# Patient Record
Sex: Male | Born: 1995 | Race: Black or African American | Hispanic: No | Marital: Single | Smoking: Current every day smoker
Health system: Southern US, Community
[De-identification: ages and names within clinical notes are randomized; demographics above are authoritative.]

## PROBLEM LIST (undated history)

## (undated) HISTORY — PX: WISDOM TOOTH EXTRACTION: SHX21

---
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
## 2014-05-25 ENCOUNTER — Ambulatory Visit (INDEPENDENT_AMBULATORY_CARE_PROVIDER_SITE_OTHER): Payer: BC Managed Care – PPO | Admitting: *Deleted

## 2014-05-25 ENCOUNTER — Ambulatory Visit: Payer: BC Managed Care – PPO | Admitting: *Deleted

## 2014-05-25 DIAGNOSIS — Z23 Encounter for immunization: Secondary | ICD-10-CM | POA: Diagnosis not present

## 2014-05-30 ENCOUNTER — Ambulatory Visit (INDEPENDENT_AMBULATORY_CARE_PROVIDER_SITE_OTHER): Payer: BC Managed Care – PPO | Admitting: Family Medicine

## 2014-05-30 ENCOUNTER — Encounter: Payer: Self-pay | Admitting: Family Medicine

## 2014-05-30 VITALS — BP 134/89 | HR 102 | Temp 98.4°F | Ht 64.0 in | Wt 223.2 lb

## 2014-05-30 DIAGNOSIS — S8982XA Other specified injuries of left lower leg, initial encounter: Secondary | ICD-10-CM

## 2014-05-30 DIAGNOSIS — S838X2A Sprain of other specified parts of left knee, initial encounter: Secondary | ICD-10-CM | POA: Insufficient documentation

## 2014-05-30 NOTE — Patient Instructions (Signed)
Rest, ice and elevate your leg Try to have light duty Work with physical therapy You may need an MRI in the future if this doesn't improve.

## 2014-05-30 NOTE — Assessment & Plan Note (Addendum)
History and exam concerning but not typical for meniscal injury. No ligamentous laxity to indicate MRI at this point. Will refer for PT for further evaluation and management. If this could potentially be improved with surgery I would pull the trigger on the MRI.

## 2014-05-30 NOTE — Progress Notes (Signed)
Patient ID: Maurice Rubio, male   DOB: Nov 16, 1995, [age] y.o.   MRN: [id number]   Subjective:  Maurice CambridgeDashon Gallop is a 18 y.o. male here for recurrent left knee pain.  Date of injury: 05/27/2014 While running left knee suddenly felt painful on the medial aspect without popping or other sound. No instability/locking/giving way. Effusion developed over next few hours and has gradually gone down. Reports prior non-contact injury while playing basketball in March. X-rays from that encounter at urgent care showed nothing significant. He received no therapy and no therapy was recommended.  No fever, other arthralgias, myalgias, rash.   All other pertinent systems reviewed and are negative. Objective:  BP 134/89 mmHg  Pulse 102  Temp(Src) 98.4 F (36.9 C) (Oral)  Ht 5\' 4"  (1.626 m)  Wt 223 lb 3.2 oz (101.243 kg)  BMI 38.29 kg/m2  Gen: Athletic 18 y.o. male in no distress Left knee: Trace effusion without erythema. No pain on patellar palpation. +crepitus. Medial joint line tender. Lateral joint line nontender. Pain but no laxity with varus and valgus stress. Pain without laxity with anterior drawer or posterior drawer.  McMurray's positive. Full AROM. Gait normal.  Assessment & Plan:  Maurice CambridgeDashon Rigel is a 18 y.o. male with left knee re-injury concerning for meniscal pathology.

## 2014-06-14 ENCOUNTER — Ambulatory Visit: Payer: BC Managed Care – PPO | Admitting: Physical Therapy

## 2014-06-21 ENCOUNTER — Telehealth: Payer: Self-pay | Admitting: *Deleted

## 2014-06-21 ENCOUNTER — Ambulatory Visit: Payer: BC Managed Care – PPO | Attending: Family Medicine | Admitting: Physical Therapy

## 2014-06-21 DIAGNOSIS — Z5189 Encounter for other specified aftercare: Secondary | ICD-10-CM | POA: Diagnosis not present

## 2014-06-21 DIAGNOSIS — S838X2A Sprain of other specified parts of left knee, initial encounter: Secondary | ICD-10-CM

## 2014-06-21 DIAGNOSIS — S8992XD Unspecified injury of left lower leg, subsequent encounter: Secondary | ICD-10-CM | POA: Diagnosis not present

## 2014-06-21 DIAGNOSIS — X58XXXD Exposure to other specified factors, subsequent encounter: Secondary | ICD-10-CM | POA: Diagnosis not present

## 2014-06-21 DIAGNOSIS — M25562 Pain in left knee: Secondary | ICD-10-CM | POA: Diagnosis not present

## 2014-06-21 NOTE — Patient Instructions (Signed)
ADDUCTION: Side-Lying (Active)   Lie on left side, with top leg bent and in front of other leg. Lift straight leg up as high as possible.  Complete _1__ sets of _10__ repetitions. Perform _2-3__ sessions per day.  http://gtsc.exer.us/128   Copyright  VHI. All rights reserved.   Straight Leg Raise: With External Leg Rotation   Lie on back with right leg straight, opposite leg bent. Rotate straight leg out and lift _6-8___ inches. Hold 3 seconds. Repeat __10__ times per set. Do __1__ sets per session. Do _2-3___ sessions per day.  http://orth.exer.us/728   Copyright  VHI. All rights reserved.   Clarita CraneStephanie F Dynisha Due, PT, DPT 06/21/2014 12:19 PM  [facility] [address] [place], [place] [address]  [phone] (office) [phone] (fax)

## 2014-06-21 NOTE — Therapy (Signed)
[facility] [address] [place], [place], [address] Phone: [phone]   Fax:  [phone]  Physical Therapy Evaluation  Patient Details  Name: Maurice Rubio MRN: [id number] Date of Birth: 09-21-1995  Encounter Date: 06/21/2014      PT End of Session - 06/21/14 1241    Visit Number 1   Number of Visits 8   Date for PT Re-Evaluation 08/20/14   PT Start Time 1158  pt arrived 10 min late for appt   PT Stop Time 1225   PT Time Calculation (min) 27 min   Activity Tolerance Patient tolerated treatment well   Behavior During Therapy [facility] for tasks assessed/performed      No past medical history on file.  No past surgical history on file.  There were no vitals taken for this visit.  Visit Diagnosis:  Acute meniscal injury of left knee, initial encounter - Plan: PT plan of care cert/re-cert  Knee pain, acute, left - Plan: PT plan of care cert/re-cert      Subjective Assessment - 06/21/14 1159    Symptoms Pt is a 18 y/o male who presents to OPPT for L knee injury.  Pt reports he "tore 2 ligaments" in L knee about 6 months ago.  Pt reports he was playing basketball when injury occurred and continues to report  instability and subluxation.   Limitations Walking   How long can you walk comfortably? 45 min-1 hour   Patient Stated Goals improve strength and stability of knee, decrease pain   Currently in Pain? Yes   Pain Score 5    Pain Location Knee   Pain Orientation Left   Pain Descriptors / Indicators Aching   Pain Type Chronic pain   Pain Onset 1 to 4 weeks ago   Pain Frequency Intermittent   Aggravating Factors  activity   Pain Relieving Factors medication          [facility] PT Assessment - 06/21/14 1202    Assessment   Medical Diagnosis L knee injury   Onset Date 12/20/13  approximate   Next MD Visit PRN   Prior Therapy none for knee   Precautions   Precautions None   Restrictions   Weight Bearing Restrictions No    Balance Screen   Has the patient fallen in the past 6 months No   Has the patient had a decrease in activity level because of a fear of falling?  No   Is the patient reluctant to leave their home because of a fear of falling?  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[employer]; Midwifestudying fire protection   Leisure basketball   Cognition   Overall Cognitive Status Within Functional Limits for tasks assessed   Observation/Other Assessments   Observations point tenderness at medial tibial plateau L knee   Focus on Therapeutic Outcomes (FOTO)  FOTO 40 (60% limited; predicted 34% limited)   AROM   Left Knee Extension 0   Left Knee Flexion 120  ERP; equal to L knee   Strength   Right Hip Flexion 5/5   Right Hip Extension 5/5   Right Hip ABduction 4/5   Left Hip Flexion 4/5   Left Hip Extension 3+/5  Left Hip ABduction 4/5   Left Hip ADduction 3+/5   Right Knee Flexion 5/5   Right Knee Extension 5/5   Left Knee Flexion 4/5   Left Knee Extension 3+/5  with pain   Special Tests    Special Tests Knee Special Tests;Plica Tests;Meniscus Tests   Knee Special tests  other;other2   Meniscus Tests McMurray Test   other    Side  Left   Comments valgus stress test with 20 degrees flexion revealed pain and mild instability   other   findings Negative   Side Left   Comments Anterior Drawer   McMurray Test   Findings Positive   Side Left   Comments pain with both internal and external rotation            PT Education - 06/21/14 1241    Education provided Yes   Education Details HEP for VMO and hip addct strengthening   Person(s) Educated Patient   Methods Demonstration;Handout;Explanation    Comprehension Verbalized understanding;Returned demonstration;Need further instruction;Verbal cues required            PT Long Term Goals - 06/21/14 1244    PT LONG TERM GOAL #1   Title independent with HEP (07/19/14)   Baseline no HEP   Time 4   Period Weeks   Status New   PT LONG TERM GOAL #2   Title report pain < 3/10 (07/19/14)   Baseline pain 6/10 with movement   Time 4   Period Weeks   Status New   PT LONG TERM GOAL #3   Title report no episodes of knee instability or subluxation to demonstrate improved strength (07/19/14)   Baseline episodes of L knee instability and pain; L knee/hip weakness   Time 4   Period Weeks   Status New          Plan - 06/21/14 1241    Clinical Impression Statement Pt presents to OPPT with L knee instability and pain.  Pt with VMO weakness and will benefit from PT to decrease pain and maximize function.   Pt will benefit from skilled therapeutic intervention in order to improve on the following deficits Pain;Decreased strength;Impaired perceived functional ability;Decreased activity tolerance;Decreased range of motion   Rehab Potential Good   PT Frequency 2x / week   PT Duration 4 weeks   PT Treatment/Interventions ADLs/Self Care Home Management;Moist Heat;Therapeutic activities;Patient/family education;Passive range of motion;Therapeutic exercise;Ultrasound;Balance training;Manual techniques;Neuromuscular re-education;Cryotherapy;Electrical Stimulation;Functional mobility training   PT Next Visit Plan review HEP and progress VMO/hip/knee strengthening   Consulted and Agree with Plan of Care Patient                              Problem List Patient Active Problem List   Diagnosis Date Noted  . Acute medial meniscal injury of left knee 05/30/2014   Clarita CraneStephanie F Kache Mcclurg, PT, DPT 06/21/2014 12:48 PM  [facility] [address] [place], [place] [address]  [phone] (office) [phone]  (fax)

## 2014-06-21 NOTE — Telephone Encounter (Signed)
appts made and printed...td 

## 2014-06-27 ENCOUNTER — Ambulatory Visit: Payer: BC Managed Care – PPO | Admitting: Physical Therapy

## 2014-06-27 DIAGNOSIS — Z5189 Encounter for other specified aftercare: Secondary | ICD-10-CM | POA: Diagnosis not present

## 2014-06-27 DIAGNOSIS — S838X2A Sprain of other specified parts of left knee, initial encounter: Secondary | ICD-10-CM

## 2014-06-27 DIAGNOSIS — M25562 Pain in left knee: Secondary | ICD-10-CM

## 2014-06-27 NOTE — Therapy (Signed)
[facility] [address] [place], [place], [address] Phone: [phone]   Fax:  [phone]  Physical Therapy Treatment  Patient Details  Name: Maurice CambridgeDashon Pettaway MRN: [id number] Date of Birth: Oct 29, 1995  Encounter Date: 06/27/2014      PT End of Session - 06/27/14 1341    PT Start Time 1105   PT Stop Time 1200   PT Time Calculation (min) 55 min   Activity Tolerance Patient tolerated treatment well      No past medical history on file.  No past surgical history on file.  There were no vitals taken for this visit.  Visit Diagnosis:  Acute meniscal injury of left knee, initial encounter  Knee pain, acute, left      Subjective Assessment - 06/27/14 1139    Currently in Pain? Yes   Pain Score 5    Pain Relieving Factors rest   Effect of Pain on Daily Activities increase with activities.            [facility] Adult PT Treatment/Exercise - 06/27/14 1115    Knee/Hip Exercises: Stretches   Active Hamstring Stretch 3 reps;30 seconds   Gastroc Stretch 3 reps;30 seconds   Knee/Hip Exercises: Supine   Quad Sets 2 sets  5 seconds   Short Arc Quad Sets Limitations --  0 Lbs. cued to keep knee neutral to decrease pain, 3 second    Straight Leg Raises 2 sets  6 inches, 5 second holds.   Straight Leg Raises Limitations --  shakey   Knee/Hip Exercises: Sidelying   Hip ABduction 2 sets;Left   Hip ADduction 2 sets  doing for home exercise   Knee/Hip Exercises: Prone   Hip Extension 2 sets;Left   Cryotherapy   Number Minutes Cryotherapy --  10   Cryotherapy Location Knee  Left              PT Long Term Goals - 06/27/14 1133    PT LONG TERM GOAL #1   Title independent with HEP (07/19/14)   Time 4   Period Weeks   Status On-going   PT LONG TERM GOAL #2   Title report pain < 3/10 (07/19/14)   Time 4   Period Weeks   Status On-going   PT LONG TERM GOAL #3   Title report no episodes of knee instability or subluxation to  demonstrate improved strength (07/19/14)   Time 4   Period Weeks   Status On-going          Plan - 06/27/14 1342    Clinical Impression Statement soreness with                                Problem List Patient Active Problem List   Diagnosis Date Noted  . Acute medial meniscal injury of left knee 05/30/2014  Liz BeachKaren Harris, PTA 06/27/2014 1:48 PM Phone: [phone] Fax: [phone]   HARRIS,KAREN 06/27/2014, 1:48 PM

## 2014-06-29 ENCOUNTER — Ambulatory Visit: Payer: BC Managed Care – PPO | Admitting: Physical Therapy

## 2014-07-04 ENCOUNTER — Ambulatory Visit: Payer: BC Managed Care – PPO | Admitting: Physical Therapy

## 2014-07-04 DIAGNOSIS — Z5189 Encounter for other specified aftercare: Secondary | ICD-10-CM | POA: Diagnosis not present

## 2014-07-04 DIAGNOSIS — S838X2A Sprain of other specified parts of left knee, initial encounter: Secondary | ICD-10-CM

## 2014-07-04 NOTE — Therapy (Signed)
[facility] [address] [place], [place], [address] Phone: [phone]   Fax:  [phone]  Physical Therapy Treatment  Patient Details  Name: Maurice Rubio MRN: [id number] Date of Birth: 01-14-1996  Encounter Date: 07/04/2014      PT End of Session - 07/04/14 1142    Visit Number 3   Number of Visits 8   Date for PT Re-Evaluation 07/20/14   PT Start Time 1105   PT Stop Time 1145   PT Time Calculation (min) 40 min   Activity Tolerance Patient tolerated treatment well      No past medical history on file.  No past surgical history on file.  There were no vitals taken for this visit.  Visit Diagnosis:  Acute meniscal injury of left knee, initial encounter      Subjective Assessment - 07/04/14 1107    Symptoms No pain now, last pain at the gym with squats with pops.                      [facility] Adult PT Treatment/Exercise - 07/04/14 1105    Knee/Hip Exercises: Aerobic   Stationary Bike 5 minutes   Knee/Hip Exercises: Machines for Strengthening   Cybex Knee Extension --  2 plates extend, too heavy so 1  Left knee hold and release.   Cybex Knee Flexion --  2 plates bend Lt  leg eccentric 15 reps.   Total Gym Leg Press 20 reps, 2 legs   Knee/Hip Exercises: Standing   Forward Step Up Left;2 sets   Wall Squat 10 reps;3 seconds  with ball   Knee/Hip Exercises: Supine   Bridges 2 sets  with ball squeeze   Knee/Hip Exercises: Sidelying   Hip ABduction 2 sets;Left   Hip ADduction Limitations 3+/5   Knee/Hip Exercises: Prone   Hip Extension --  quadriped bent knee lifts, difficult   Hip Extension Limitations 3 +/5   Straight Leg Raises Limitations --  15 reps 2 sets   Other Prone Exercises --  Adduction 3+/5                     PT Long Term Goals - 07/04/14 1108    PT LONG TERM GOAL #2   Title report pain < 3/10 (07/19/14)   Time 4   Status On-going   PT LONG TERM GOAL #3   Title  report no episodes of knee instability or subluxation to demonstrate improved strength (07/19/14)   Time 4   Period Weeks   Status Achieved               Plan - 07/04/14 1143    Clinical Impression Statement strength improving.  Needed cues for squat technique which was painful in the gym,  able to do no pain here.   PT Next Visit Plan add hip strengthening program to home.   Consulted and Agree with Plan of Care Patient        Problem List Patient Active Problem List   Diagnosis Date Noted  . Acute medial meniscal injury of left knee 05/30/2014  Liz BeachKaren Kamela Blansett, PTA 07/04/2014 11:45 AM Phone: [phone] Fax: [phone]   [facility],Kaylon Hitz 07/04/2014, 11:45 AM  [facility] [address] [place], [place], [address] Phone: [phone]   Fax:  [phone]

## 2014-07-07 ENCOUNTER — Ambulatory Visit: Payer: BC Managed Care – PPO | Admitting: Physical Therapy

## 2014-07-07 DIAGNOSIS — Z5189 Encounter for other specified aftercare: Secondary | ICD-10-CM | POA: Diagnosis not present

## 2014-07-07 DIAGNOSIS — M25562 Pain in left knee: Secondary | ICD-10-CM

## 2014-07-07 DIAGNOSIS — S838X2A Sprain of other specified parts of left knee, initial encounter: Secondary | ICD-10-CM

## 2014-07-07 NOTE — Therapy (Addendum)
[place], [place], [address] Phone: [phone]   Fax:  [phone]  Physical Therapy Treatment  Patient Details  Name: Lennie Vasco MRN: [id number] Date of Birth: 1996/04/01  Encounter Date: 07/07/2014      PT End of Session - 07/07/14 1150    Visit Number 4   Number of Visits 8   Date for PT Re-Evaluation 07/20/14   PT Start Time 1118   PT Stop Time 1149   PT Time Calculation (min) 31 min   Activity Tolerance Patient tolerated treatment well;Patient limited by fatigue   Behavior During Therapy [facility] for tasks assessed/performed      No past medical history on file.  No past surgical history on file.  There were no vitals taken for this visit.  Visit Diagnosis:  Acute meniscal injury of left knee, initial encounter  Knee pain, acute, left      Subjective Assessment - 07/07/14 1120    Symptoms Knee is "ok."  Still concerned about knee buckling but no occurrences yet.  Feels knee is getting stronger. (Pt arrived late to PT session)   How long can you walk comfortably? no difficulty recently   Patient Stated Goals improve strength and stability of knee, decrease pain   Currently in Pain? No/denies  pain yesterday, 4/10 L knee playing basketball                    [facility] Adult PT Treatment/Exercise - 07/07/14 1122    Knee/Hip Exercises: Aerobic   Elliptical Incline 5, Resistance 4 x 8 min   Knee/Hip Exercises: Machines for Strengthening   Cybex Leg Press 120# with ball squeeze 2x15; LLE only 60# 2x 15 reps   Knee/Hip Exercises: Supine   Short Arc Quad Sets Strengthening;Left;15 reps;1 Tax inspector Limitations 3# with initially a little pain; decreased with repetition   Bridges 15 reps;Both  with ball squeeze   Straight Leg Raise with External Rotation Strengthening;Left;15 reps   Straight Leg Raise with External Rotation Limitations 3#   Knee/Hip Exercises:  Sidelying   Hip ABduction Left;15 reps;1 set;Strengthening   Hip ABduction Limitations 3#   Knee/Hip Exercises: Prone   Hamstring Curl 1 set;15 reps   Hamstring Curl Limitations 3#   Hip Extension Strengthening;Left;15 reps  with bent knee   Hip Extension Limitations 3#   Straight Leg Raises Left;Strengthening;1 set;15 reps   Straight Leg Raises Limitations 3#   Other Prone Exercises quadruped bent knee hip ext x 10 bil; quadruped hip abdct x 10 bil                PT Education - 07/07/14 1150    Education provided No             PT Long Term Goals - 07/07/14 1152    PT LONG TERM GOAL #1   Title independent with HEP (07/19/14)   Baseline no HEP   Time 4   Period Weeks   Status On-going   PT LONG TERM GOAL #2   Title report pain < 3/10 (07/19/14)   Baseline pain 6/10 with movement   Time 4   Period Weeks   Status On-going   PT LONG TERM GOAL #3   Title report no episodes of knee instability or subluxation to demonstrate improved strength (07/19/14)   Baseline episodes of L knee instability and pain; L knee/hip weakness   Period Weeks   Status Achieved  Plan - 07/07/14 1151    Clinical Impression Statement Pt noticably fatigued with hip and knee strengthening exercises.  Progressing well towards goals without any episodes of L knee buckling.   PT Next Visit Plan add hip strengthening program to home (quadruped)   Consulted and Agree with Plan of Care Patient        Problem List Patient Active Problem List   Diagnosis Date Noted  . Acute medial meniscal injury of left knee 05/30/2014    Laureen Abrahams, PT, DPT 07/07/2014 11:53 AM  [facility] [address] [place], [place], [address] Phone: [phone]   Fax:  [phone]     PHYSICAL THERAPY DISCHARGE SUMMARY  Visits from Start of Care: 4  Current functional level related to goals / functional outcomes: See above;  pt met 1 LTG and did not show for remaining scheduled appointments.   Remaining deficits: Unknown    Education / Equipment: HEP  Plan: Patient agrees to discharge.  Patient goals were partially met. Patient is being discharged due to not returning since the last visit.  ?????    Laureen Abrahams, PT, DPT 09/16/2014 8:54 AM  [facility] [address],  [address]  [phone] (office) [phone] (fax)

## 2014-07-19 ENCOUNTER — Ambulatory Visit: Payer: BC Managed Care – PPO | Attending: Family Medicine | Admitting: Physical Therapy

## 2014-07-19 DIAGNOSIS — S8992XD Unspecified injury of left lower leg, subsequent encounter: Secondary | ICD-10-CM | POA: Insufficient documentation

## 2014-07-19 DIAGNOSIS — M25562 Pain in left knee: Secondary | ICD-10-CM | POA: Insufficient documentation

## 2014-07-19 DIAGNOSIS — Z5189 Encounter for other specified aftercare: Secondary | ICD-10-CM | POA: Insufficient documentation

## 2014-07-21 ENCOUNTER — Ambulatory Visit: Payer: BC Managed Care – PPO | Admitting: Physical Therapy

## 2014-11-10 ENCOUNTER — Encounter (HOSPITAL_COMMUNITY): Payer: Self-pay | Admitting: *Deleted

## 2014-11-10 ENCOUNTER — Emergency Department (INDEPENDENT_AMBULATORY_CARE_PROVIDER_SITE_OTHER)
Admission: EM | Admit: 2014-11-10 | Discharge: 2014-11-10 | Disposition: A | Discharge status: Home / Self Care | Payer: BC Managed Care – PPO | Source: Home / Self Care | Attending: Emergency Medicine | Admitting: Emergency Medicine

## 2014-11-10 ENCOUNTER — Emergency Department (INDEPENDENT_AMBULATORY_CARE_PROVIDER_SITE_OTHER): Payer: BC Managed Care – PPO

## 2014-11-10 DIAGNOSIS — S62639A Displaced fracture of distal phalanx of unspecified finger, initial encounter for closed fracture: Secondary | ICD-10-CM

## 2014-11-10 MED ORDER — TRAMADOL HCL 50 MG PO TABS: 50.0000 mg | ORAL_TABLET | Freq: Four times a day (QID) | ORAL | Status: DC | PRN

## 2014-11-10 MED ORDER — IBUPROFEN 800 MG PO TABS: 800.0000 mg | ORAL_TABLET | Freq: Three times a day (TID) | ORAL | Status: DC

## 2014-11-10 NOTE — ED Notes (Signed)
Pt  Reports  He  smashed  His  r  Pointer  Finger  With a    Hammer    yest      Pt has   Pain    And  Swelling   To the  finger  With   Bleeding  Under  The  Nail

## 2014-11-10 NOTE — Discharge Instructions (Signed)
Finger Fracture °Fractures of fingers are breaks in the bones of the fingers. There are many types of fractures. There are different ways of treating these fractures. Your health care provider will discuss the best way to treat your fracture. °CAUSES °Traumatic injury is the main cause of broken fingers. These include: °· Injuries while playing sports. °· Workplace injuries. °· Falls. °RISK FACTORS °Activities that can increase your risk of finger fractures include: °· Sports. °· Workplace activities that involve machinery. °· A condition called osteoporosis, which can make your bones less dense and cause them to fracture more easily. °SIGNS AND SYMPTOMS °The main symptoms of a broken finger are pain and swelling within 15 minutes after the injury. Other symptoms include: °· Bruising of your finger. °· Stiffness of your finger. °· Numbness of your finger. °· Exposed bones (compound fracture) if the fracture is severe. °DIAGNOSIS  °The best way to diagnose a broken bone is with X-ray imaging. Additionally, your health care provider will use this X-ray image to evaluate the position of the broken finger bones.  °TREATMENT  °Finger fractures can be treated with:  °· Nonreduction--This means the bones are in place. The finger is splinted without changing the positions of the bone pieces. The splint is usually left on for about a week to 10 days. This will depend on your fracture and what your health care provider thinks. °· Closed reduction--The bones are put back into position without using surgery. The finger is then splinted. °· Open reduction and internal fixation--The fracture site is opened. Then the bone pieces are fixed into place with pins or some type of hardware. This is seldom required. It depends on the severity of the fracture. °HOME CARE INSTRUCTIONS  °· Follow your health care provider's instructions regarding activities, exercises, and physical therapy. °· Only take over-the-counter or prescription  medicines for pain, discomfort, or fever as directed by your health care provider. °SEEK MEDICAL CARE IF: °You have pain or swelling that limits the motion or use of your fingers. °SEEK IMMEDIATE MEDICAL CARE IF:  °Your finger becomes numb. °MAKE SURE YOU:  °· Understand these instructions. °· Will watch your condition. °· Will get help right away if you are not doing well or get worse. °Document Released: 10/13/2000 Document Revised: 04/21/2013 Document Reviewed: 02/10/2013 °ExitCare® Patient Information ©2015 [employer]. This information is not intended to replace advice given to you by your health care provider. Make sure you discuss any questions you have with your health care provider. ° °Cast or Splint Care °Casts and splints support injured limbs and keep bones from moving while they heal.  °HOME CARE °· Keep the cast or splint uncovered during the drying period. °¨ A plaster cast can take 24 to 48 hours to dry. °¨ A fiberglass cast will dry in less than 1 hour. °· Do not rest the cast on anything harder than a pillow for 24 hours. °· Do not put weight on your injured limb. Do not put pressure on the cast. Wait for your doctor's approval. °· Keep the cast or splint dry. °¨ Cover the cast or splint with a plastic bag during baths or wet weather. °¨ If you have a cast over your chest and belly (trunk), take sponge baths until the cast is taken off. °¨ If your cast gets wet, dry it with a towel or blow dryer. Use the cool setting on the blow dryer. °· Keep your cast or splint clean. Wash a dirty cast with a damp   cloth. °· Do not put any objects under your cast or splint. °· Do not scratch the skin under the cast with an object. If itching is a problem, use a blow dryer on a cool setting over the itchy area. °· Do not trim or cut your cast. °· Do not take out the padding from inside your cast. °· Exercise your joints near the cast as told by your doctor. °· Raise (elevate) your injured limb on 1 or 2 pillows  for the first 1 to 3 days. °GET HELP IF: °· Your cast or splint cracks. °· Your cast or splint is too tight or too loose. °· You itch badly under the cast. °· Your cast gets wet or has a soft spot. °· You have a bad smell coming from the cast. °· You get an object stuck under the cast. °· Your skin around the cast becomes red or sore. °· You have new or more pain after the cast is put on. °GET HELP RIGHT AWAY IF: °· You have fluid leaking through the cast. °· You cannot move your fingers or toes. °· Your fingers or toes turn blue or white or are cool, painful, or puffy (swollen). °· You have tingling or lose feeling (numbness) around the injured area. °· You have bad pain or pressure under the cast. °· You have trouble breathing or have shortness of breath. °· You have chest pain. °Document Released: 10/31/2010 Document Revised: 03/03/2013 Document Reviewed: 01/07/2013 °ExitCare® Patient Information ©2015 [employer]. This information is not intended to replace advice given to you by your health care provider. Make sure you discuss any questions you have with your health care provider. ° °

## 2014-11-10 NOTE — ED Provider Notes (Signed)
CSN: [id number]     Arrival date & time 11/10/14  1021 History   None    Chief Complaint  Patient presents with  . Finger Injury   (Consider location/radiation/quality/duration/timing/severity/associated sxs/prior Treatment) HPI       19 year old male presents for evaluation of right index finger pain. He smashed it with a hammer yesterday. Now there is some bleeding under the nail and some bruising in the finger. It is extremely painful and tender. No numbness. No other injury  History reviewed. No pertinent past medical history. History reviewed. No pertinent past surgical history. History reviewed. No pertinent family history. History  Substance Use Topics  . Smoking status: Never Smoker   . Smokeless tobacco: Not on file  . Alcohol Use: No    Review of Systems  Musculoskeletal:       Right index finger pain and swelling  All other systems reviewed and are negative.   Allergies  Review of patient's allergies indicates no known allergies.  Home Medications   Prior to Admission medications   Medication Sig Start Date End Date Taking? Authorizing Provider  ibuprofen (ADVIL,MOTRIN) 800 MG tablet Take 1 tablet (800 mg total) by mouth 3 (three) times daily. 11/10/14   Graylon Good, PA-C  traMADol (ULTRAM) 50 MG tablet Take 1 tablet (50 mg total) by mouth every 6 (six) hours as needed. 11/10/14   Adrian Blackwater Donterius Filley, PA-C   BP 142/87 mmHg  Pulse 71  Temp(Src) 98.1 F (36.7 C) (Oral)  Resp 16  SpO2 100% Physical Exam  Constitutional: He is oriented to person, place, and time. He appears well-developed and well-nourished. No distress.  HENT:  Head: Normocephalic.  Pulmonary/Chest: Effort normal. No respiratory distress.  Musculoskeletal:       Right hand: He exhibits tenderness and swelling.  The right index finger over the distal phalange is swollen, ecchymotic. There is no subungual hematoma. It is tender. No loss of sensation  Neurological: He is alert and oriented to  person, place, and time. Coordination normal.  Skin: Skin is warm and dry. No rash noted. He is not diaphoretic.  Psychiatric: He has a normal mood and affect. Judgment normal.  Nursing note and vitals reviewed.   ED Course  Procedures (including critical care time) Labs Review Labs Reviewed - No data to display  Imaging Review Dg Finger Index Right  11/10/2014   CLINICAL DATA:  Finger hit with sledgehammer  EXAM: RIGHT SECOND FINGER 2+V  COMPARISON:  None.  FINDINGS: Frontal, oblique, and lateral views obtained. There is a tiny calcification located just lateral to the distal aspect of the second distal phalanx, likely representing a tiny avulsion type injury. No other evidence suggesting fracture. No dislocation. Joint spaces appear intact.  IMPRESSION: Tiny avulsion arising from the lateral aspect of the distal aspect of the second distal phalanx. No other evidence fracture. No dislocation. No appreciable arthropathic change.   Electronically Signed   By: Bretta Bang III M.D.   On: 11/10/2014 12:11     MDM   1. Closed fracture of tuft of distal phalanx of finger, initial encounter    Very minor avulsion fracture. We'll put on a finger splint for protection and have him follow-up with orthopedics.  Meds ordered this encounter  Medications  . ibuprofen (ADVIL,MOTRIN) 800 MG tablet    Sig: Take 1 tablet (800 mg total) by mouth 3 (three) times daily.    Dispense:  60 tablet    Refill:  0  . traMADol (  ULTRAM) 50 MG tablet    Sig: Take 1 tablet (50 mg total) by mouth every 6 (six) hours as needed.    Dispense:  15 tablet    Refill:  0     Graylon GoodZachary H Ebb Carelock, PA-C 11/10/14 1227

## 2014-11-21 ENCOUNTER — Ambulatory Visit (INDEPENDENT_AMBULATORY_CARE_PROVIDER_SITE_OTHER): Payer: BC Managed Care – PPO | Admitting: Family Medicine

## 2014-11-21 ENCOUNTER — Encounter: Payer: Self-pay | Admitting: Family Medicine

## 2014-11-21 VITALS — BP 130/81 | HR 69 | Temp 98.8°F | Ht 64.0 in | Wt 234.0 lb

## 2014-11-21 DIAGNOSIS — S8982XA Other specified injuries of left lower leg, initial encounter: Secondary | ICD-10-CM | POA: Diagnosis not present

## 2014-11-21 DIAGNOSIS — S838X2A Sprain of other specified parts of left knee, initial encounter: Secondary | ICD-10-CM

## 2014-11-21 DIAGNOSIS — S62660G Nondisplaced fracture of distal phalanx of right index finger, subsequent encounter for fracture with delayed healing: Secondary | ICD-10-CM

## 2014-11-21 DIAGNOSIS — IMO0001 Reserved for inherently not codable concepts without codable children: Secondary | ICD-10-CM | POA: Insufficient documentation

## 2014-11-21 NOTE — Assessment & Plan Note (Signed)
Healing well.  Continue in dorsal splint for about 7-10 days.  Then use PRN for anything that could damage joint. F/U PRN

## 2014-11-21 NOTE — Assessment & Plan Note (Signed)
Concern about meniscal tear, recalictrant to PT.   - MRI of L knee w/o contrast.  - F/U in 2-3 weeks to discuss results and options.

## 2014-11-21 NOTE — Patient Instructions (Signed)
Please get the MRI of your L knee.  Thanks Dr. Paulina FusiHess

## 2014-11-21 NOTE — Progress Notes (Signed)
Maurice Rubio is a 19 y.o. male who presents today for R 2nd finger fx along with L knee pain.  L Knee Pain - ongoing issue for pt for several months now.  Seen in November 2015 for L knee pain after injury, concerning for meniscal injury at that time.   He went to PT for the last several months without improvement.  Does endorse locking at times but denies giving out.  Has tried some OTC medications as well without much relief.  2nd distal phalanx fx - Initially seen in urgent care several weeks ago, X-ray of 2nd digit distal phalanx of R hand showing small avulsion fx.  Tx with finger split at DIP joint, which has done well.  Compliant with the splint, healing well, denies repeat injury or swelling.  Still with some slight pain.   No past medical history on file.  History  Smoking status  . Never Smoker   Smokeless tobacco  . Not on file    No family history on file.  Current Outpatient Prescriptions on File Prior to Visit  Medication Sig Dispense Refill  . ibuprofen (ADVIL,MOTRIN) 800 MG tablet Take 1 tablet (800 mg total) by mouth 3 (three) times daily. 60 tablet 0  . traMADol (ULTRAM) 50 MG tablet Take 1 tablet (50 mg total) by mouth every 6 (six) hours as needed. 15 tablet 0   No current facility-administered medications on file prior to visit.    ROS: Per HPI.  All other systems reviewed and are negative.   Physical Exam Filed Vitals:   11/21/14 1456  BP: 130/81  Pulse: 69  Temp: 98.8 F (37.1 C)    Physical Examination: General appearance - alert, well appearing, and in no distress Musculoskeletal -  R hand - Normal inspection.  Slight TTP at radial aspect of distal phalanx, full ROM and MS. L knee - Normal inspection, TTP at medial joint line.  Knee: ROM normal in flexion and extension and lower leg rotation. Ligaments with solid consistent endpoints including ACL, PCL, LCL, MCL. Positive Mcmurray's and provocative meniscal tests. Non painful patellar  compression. Patellar and quadriceps tendons unremarkable. Hamstring and quadriceps strength is normal.

## 2014-11-23 ENCOUNTER — Telehealth: Payer: Self-pay | Admitting: Family Medicine

## 2014-11-23 NOTE — Progress Notes (Signed)
That should be fine.  Please have this printed for him and if he needs it signed, please page me to come do this.  Thanks Tesoro CorporationBryan R. Simya Tercero, DO [facility] 11/23/2014, 11:15 AM

## 2014-11-23 NOTE — Telephone Encounter (Signed)
Letter printed and left w/ CMA.   Twana FirstBryan R. Paulina FusiHess, DO of [facility] 11/23/2014, 1:44 PM

## 2014-11-23 NOTE — Telephone Encounter (Signed)
Patient was seen in office by Dr. Paulina FusiHess on Monday, Nov 21, 2014 because of his broke finger. He is returning to work next week and would like a Physicist, medicalletter for work, with Dr. Paulina FusiHess' authorization. Please call the patient if and when the letter is ready. If, for some reason, it is not authorized, please contact the patient anyway to let him know. Thank you, Dorothey BasemanSadie Reynolds, ASA

## 2014-11-23 NOTE — Telephone Encounter (Signed)
Pt informed, he will come to pick up now. Maurice Rubio, Maurice Rubio

## 2014-11-25 ENCOUNTER — Telehealth: Payer: Self-pay | Admitting: Family Medicine

## 2014-11-25 DIAGNOSIS — S83512S Sprain of anterior cruciate ligament of left knee, sequela: Secondary | ICD-10-CM

## 2014-11-25 NOTE — Telephone Encounter (Signed)
LMOVM. Please advise patient that MRI of knee had been scheduled for Jun 1st 2016 at 11am, [facility]

## 2014-12-07 ENCOUNTER — Encounter: Payer: Self-pay | Admitting: Family Medicine

## 2014-12-07 ENCOUNTER — Ambulatory Visit (HOSPITAL_COMMUNITY)
Admission: RE | Admit: 2014-12-07 | Discharge: 2014-12-07 | Disposition: A | Discharge status: Home / Self Care | Payer: BC Managed Care – PPO | Source: Ambulatory Visit | Attending: Family Medicine | Admitting: Family Medicine

## 2014-12-07 DIAGNOSIS — Y9367 Activity, basketball: Secondary | ICD-10-CM | POA: Diagnosis not present

## 2014-12-07 DIAGNOSIS — S838X2A Sprain of other specified parts of left knee, initial encounter: Secondary | ICD-10-CM

## 2014-12-07 DIAGNOSIS — S83512D Sprain of anterior cruciate ligament of left knee, subsequent encounter: Secondary | ICD-10-CM | POA: Diagnosis not present

## 2014-12-07 DIAGNOSIS — M25562 Pain in left knee: Secondary | ICD-10-CM | POA: Diagnosis present

## 2014-12-07 NOTE — Telephone Encounter (Signed)
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  LVM to call us back for results.  Will place Ortho referral for discussion about repair.    Thanks Tes[employer] CorporationBryan R. Paulina FusiHess, DO of [facility] 12/07/2014, 5:26 PM

## 2014-12-14 ENCOUNTER — Other Ambulatory Visit (HOSPITAL_COMMUNITY): Payer: BC Managed Care – PPO

## 2016-02-08 ENCOUNTER — Ambulatory Visit (HOSPITAL_COMMUNITY)
Admission: EM | Admit: 2016-02-08 | Discharge: 2016-02-08 | Disposition: A | Discharge status: Home / Self Care | Payer: BC Managed Care – PPO | Attending: Family Medicine | Admitting: Family Medicine

## 2016-02-08 ENCOUNTER — Encounter (HOSPITAL_COMMUNITY): Payer: Self-pay | Admitting: Nurse Practitioner

## 2016-02-08 DIAGNOSIS — M249 Joint derangement, unspecified: Secondary | ICD-10-CM | POA: Diagnosis not present

## 2016-02-08 DIAGNOSIS — M25561 Pain in right knee: Secondary | ICD-10-CM | POA: Diagnosis not present

## 2016-02-08 DIAGNOSIS — M238X9 Other internal derangements of unspecified knee: Secondary | ICD-10-CM

## 2016-02-08 MED ORDER — IBUPROFEN 800 MG PO TABS: 800.0000 mg | ORAL_TABLET | Freq: Three times a day (TID) | ORAL | 0 refills | Status: DC

## 2016-02-08 NOTE — Discharge Instructions (Signed)
You have likely injured the meniscus, or cartilage center of your knee as well as your MCL. I do not feel that you have fully torn MCL but likely strained it or partially torn it. Please use the knee brace while you are awake for the next 1-2 weeks. Please also use the ibuprofen as prescribed. Please consider seeing an orthopedic surgeon if your symptoms are not improving as he may need an MRI and surgical intervention.

## 2016-02-08 NOTE — ED Triage Notes (Signed)
Pt c/o 2 day history of R knee pain after landing on it while doing a flip. He tried icy hot with no relief. Cms intact

## 2016-02-08 NOTE — ED Provider Notes (Signed)
[facility]    CSN: [id number] Arrival date & time: 02/08/16  1323  First Provider Contact:  None       History   Chief Complaint Chief Complaint  Patient presents with  . Knee Pain    HPI Maurice Rubio is a 20 y.o. male.   HPI  R knee pain Started 2 days ago after trying to do a black flip Injury on grass Hit directly on knee Immediately painful Swelling Aspirin w/ minimal benefit.  Slight improvement Now w/ sharp pain Worse w/ movements No catching  Denies any significant erythema, fevers, numbness or tingling in the right lower extremity.   Right ring finger numbness. Intermittent. Occurred ever since fracture of finger approximately one year ago.    History reviewed. No pertinent past medical history.  Patient Active Problem List   Diagnosis Date Noted  . Nondisp fx of distal phalanx of right 2nd finger with delayed healing 11/21/2014  . Acute medial meniscal injury of left knee 05/30/2014    History reviewed. No pertinent surgical history.     Home Medications    Prior to Admission medications   Medication Sig Start Date End Date Taking? Authorizing Provider  ibuprofen (ADVIL,MOTRIN) 800 MG tablet Take 1 tablet (800 mg total) by mouth 3 (three) times daily. 11/10/14   Graylon Good, PA-C  traMADol (ULTRAM) 50 MG tablet Take 1 tablet (50 mg total) by mouth every 6 (six) hours as needed. 11/10/14   Graylon Good, PA-C    Family History History reviewed. No pertinent family history.  Social History Social History  Substance Use Topics  . Smoking status: Never Smoker  . Smokeless tobacco: Never Used  . Alcohol use No     Allergies   Review of patient's allergies indicates no known allergies.   Review of Systems Review of Systems Per HPI with all other pertinent systems negative. ]   Physical Exam Triage Vital Signs ED Triage Vitals  Enc Vitals Group     BP 02/08/16 1349 122/87     Pulse Rate 02/08/16 1349 95    Resp 02/08/16 1349 16     Temp 02/08/16 1349 99.5 F (37.5 C)     Temp Source 02/08/16 1349 Oral     SpO2 02/08/16 1349 98 %     Weight --      Height --      Head Circumference --      Peak Flow --      Pain Score 02/08/16 1414 7     Pain Loc --      Pain Edu? --      Excl. in GC? --    No data found.   Updated Vital Signs BP 122/87 (BP Location: Right Arm)   Pulse 95   Temp 99.5 F (37.5 C) (Oral)   Resp 16   SpO2 98%   Visual Acuity Right Eye Distance:   Left Eye Distance:   Bilateral Distance:    Right Eye Near:   Left Eye Near:    Bilateral Near:     Physical Exam Physical Exam  Constitutional: oriented to person, place, and time. appears well-developed and well-nourished. No distress.  HENT:  Head: Normocephalic and atraumatic.  Eyes: EOMI. PERRL.  Neck: Normal range of motion.  Cardiovascular: , 2+ distal pulses,  Pulmonary/Chest: Effort normal . No respiratory distress.  Abdominal: Soft. Bowel sounds are normal. NonTTP, no distension.  Musculoskeletal:  mild right medial joint line tenderness, mild  laxity with valgus stresses, no apprehension. Lachman's negative.  Neurological: alert and oriented to person, place, and time.  Skin: Skin is warm. No rash noted. non diaphoretic.  Psychiatric: normal mood and affect. behavior is normal. Judgment and thought content normal.    UC Treatments / Results  Labs (all labs ordered are listed, but only abnormal results are displayed) Labs Reviewed - No data to display  EKG  EKG Interpretation None       Radiology No results found.  Procedures Procedures (including critical care time)  Medications Ordered in UC Medications - No data to display   Initial Impression / Assessment and Plan / UC Course  I have reviewed the triage vital signs and the nursing notes.  Pertinent labs & imaging results that were available during my care of the patient were reviewed by me and considered in my medical  decision making (see chart for details).  Clinical Course    Right knee injury likely with mild meniscal injury and medial meniscal laxity with possible partial tear. Recommending patient follow-up with orthopedics but in the interim use ibuprofen and a right knee sleeve. The sleeve applied. Of note patient states that he has a history of a torn left anterior cruciate ligament for which he did not undergo surgery as he is very reticent to do so. Work note provided. Discussed intermittent persistent numbness of right hand index finger versus fracture from trauma. Likely with persistent nerve damage. No further management at this time.  Final Clinical Impressions(s) / UC Diagnoses   Final diagnoses:  None    New Prescriptions New Prescriptions   No medications on file     Ozella Rocks, MD 02/08/16 [phone]

## 2016-06-11 IMAGING — MR MR KNEE*L* W/O CM
4 of 6 series · 19 of 40 positions shown · non-contrast
Comparison: Plain films left knee 09/20/2013.

CLINICAL DATA: History of basketball injury approximately 1 year
ago. The patient reports his patella pops out of place. Pain.

EXAM:
MRI OF THE LEFT KNEE WITHOUT CONTRAST
TECHNIQUE: Multiplanar, multisequence MR imaging of the knee was performed. No
intravenous contrast was administered.

[Series 2: PD fat-sat · sagittal · 4.0mm · 0.31mm/px · 7 of 22 slices shown (1 of 4)]
[im 1/22]
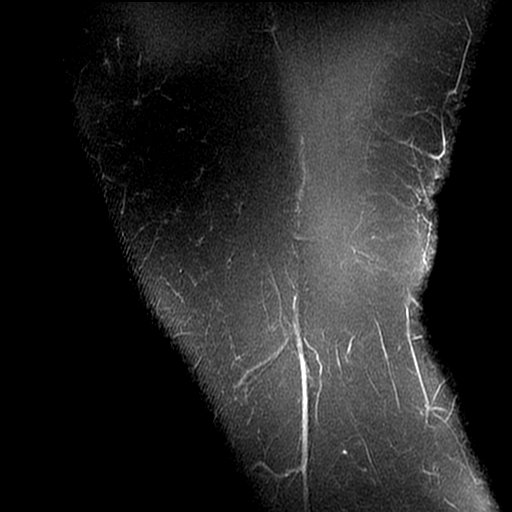
[im 4/22]
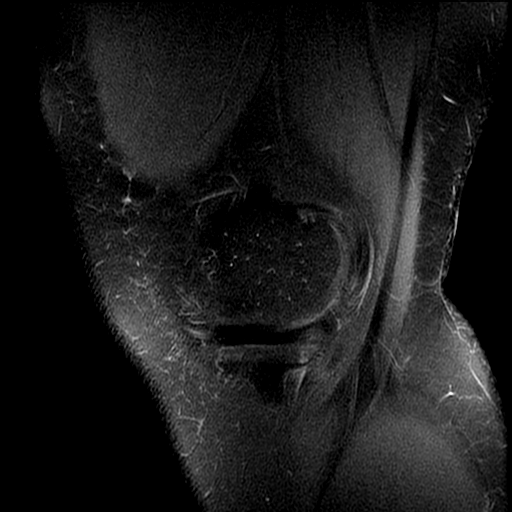
[im 8/22]
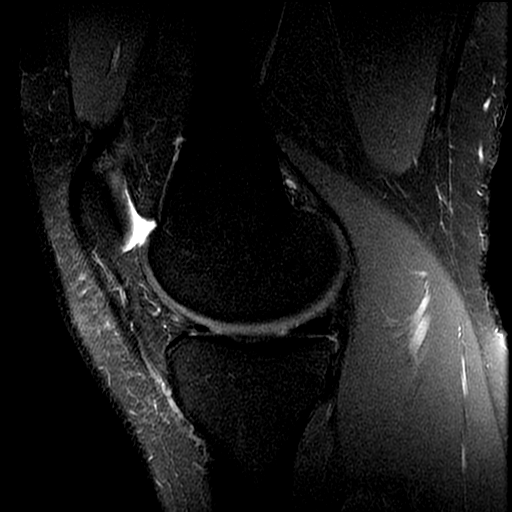
[im 11/22]
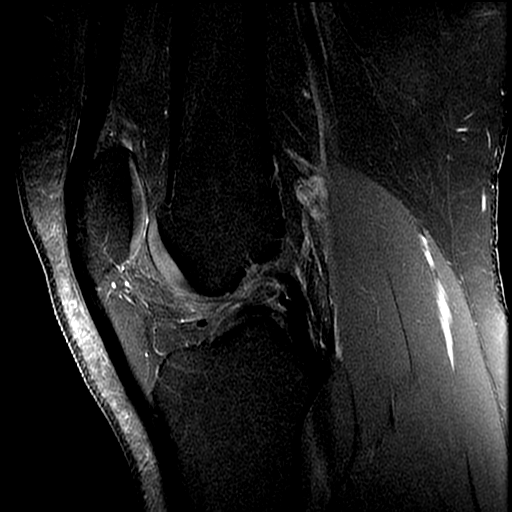
[im 15/22]
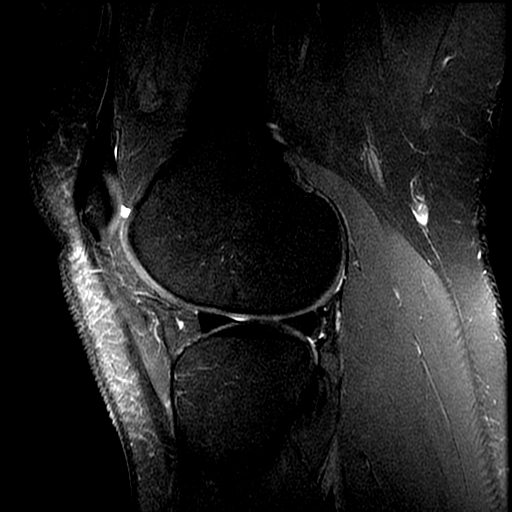
[im 18/22]
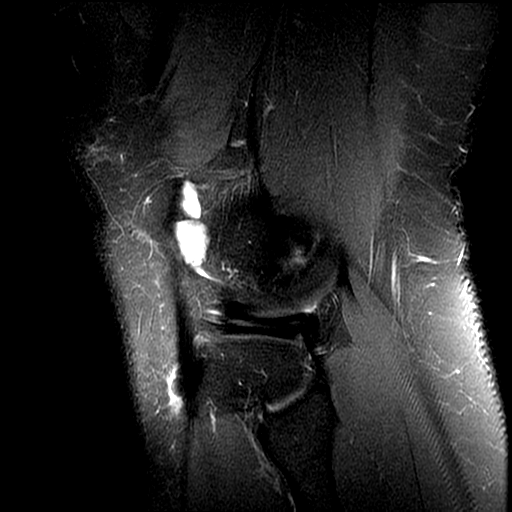
[im 22/22]
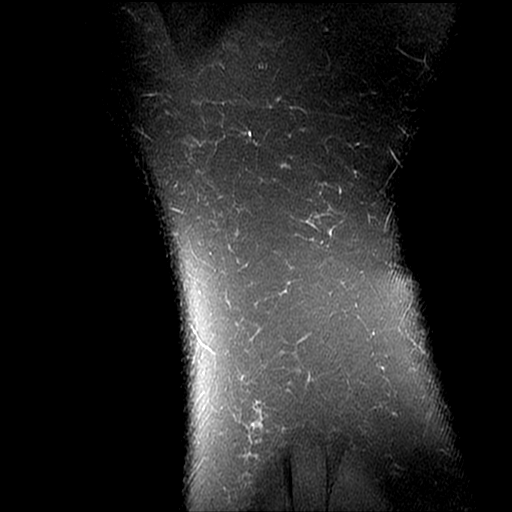

[Series 3: PD fat-sat · coronal · 4.0mm · 0.31mm/px · 6 of 22 slices shown (2 of 4)]
[im 1/22]
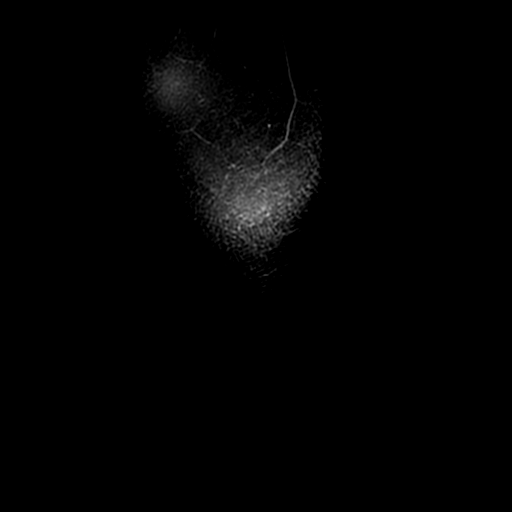
[im 4/22]
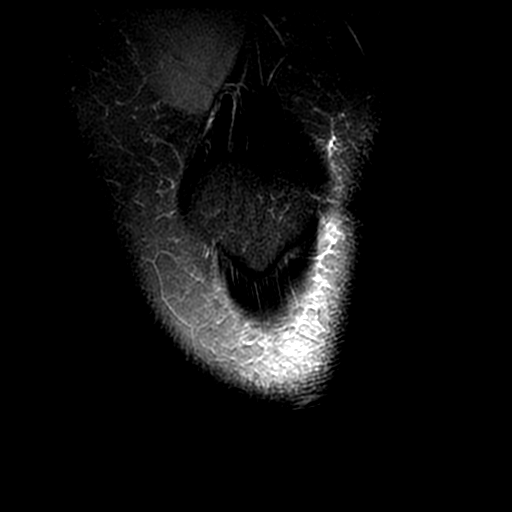
[im 8/22]
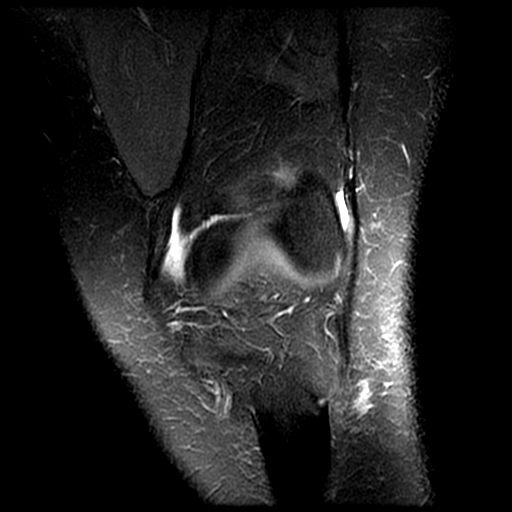
[im 11/22]
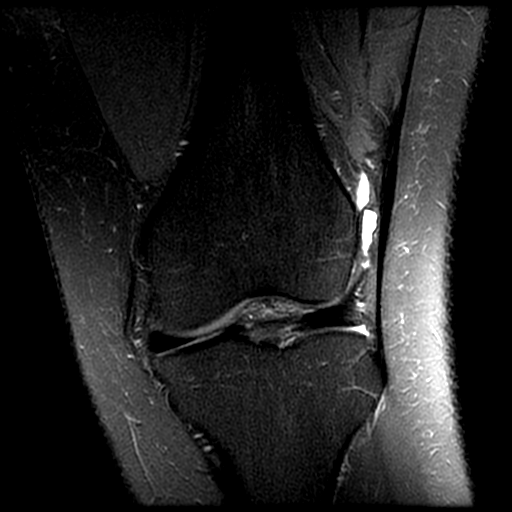
[im 15/22]
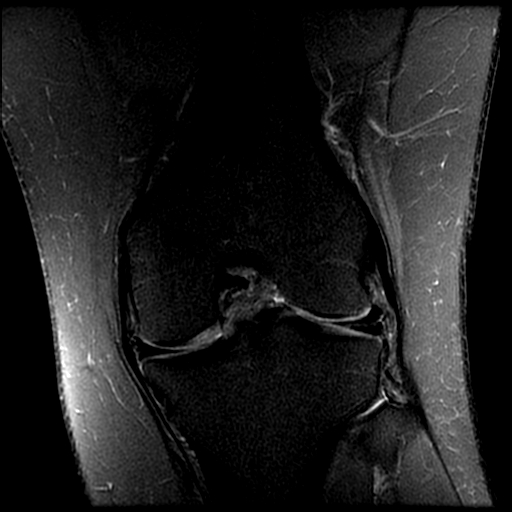
[im 18/22]
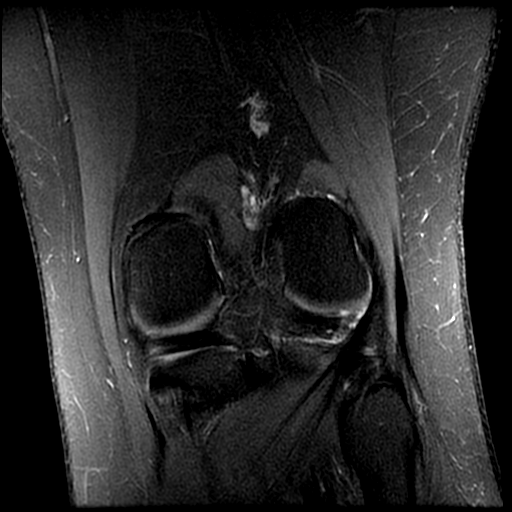

[Series 4: PD fat-sat · axial · 4.0mm · 0.29mm/px · z∈[-50,+39]mm · 3 of 26 slices shown (3 of 4)]
[im 4/26]
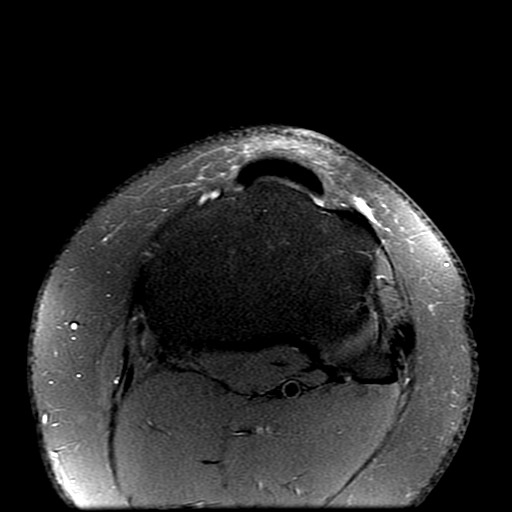
[im 15/26]
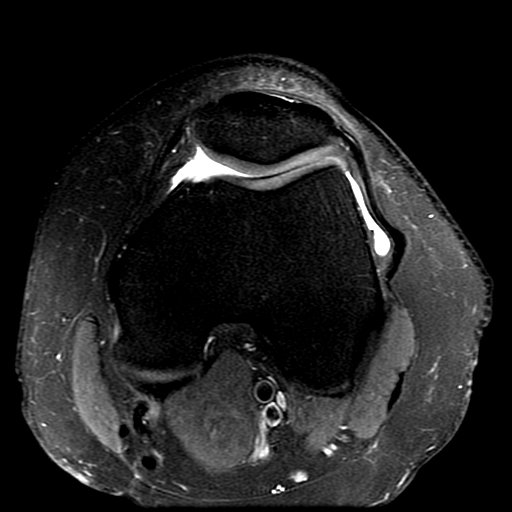
[im 22/26]
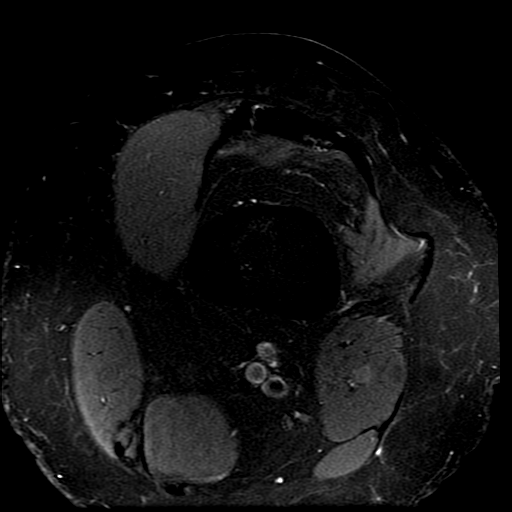

[Series 7: PD fat-sat · coronal · 2.0mm · 0.31mm/px · 3 of 12 slices shown (4 of 4)]
[im 1/12]
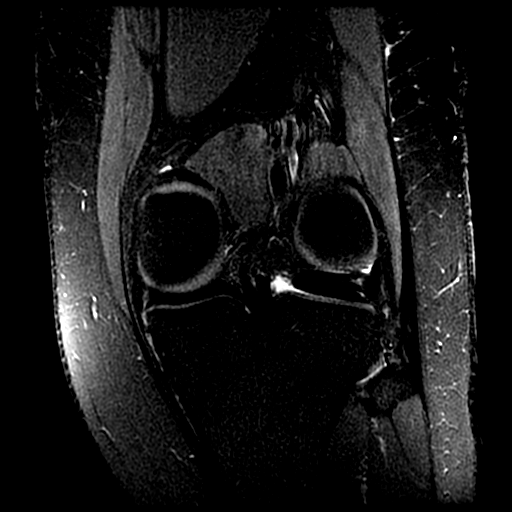
[im 8/12]
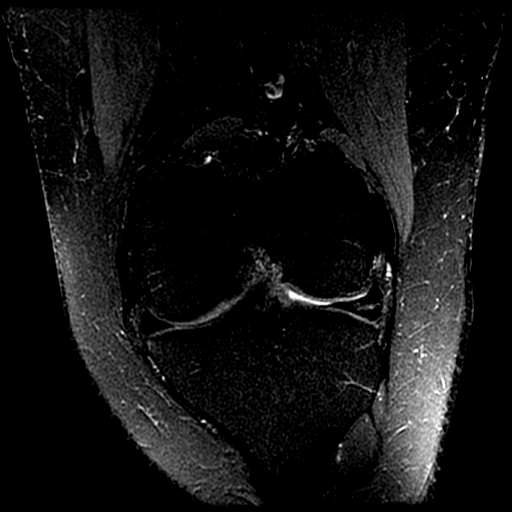
[im 12/12]
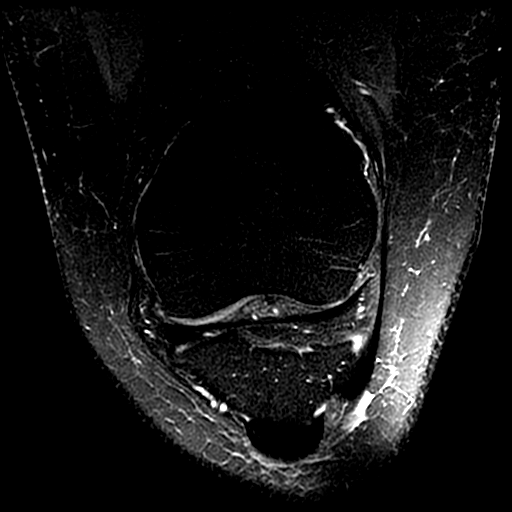

[19 of 40 positions shown; findings below may reference images not displayed]

FINDINGS: MENISCI

Medial meniscus:  Intact.

Lateral meniscus: Linear degenerative signal is identified and most
conspicuous in the body of the lateral meniscus. No tear is seen.

LIGAMENTS

Cruciates: The patient has a chronic, complete ACL tear. The
posterior cruciate ligament is intact.

Collaterals: Intact. The medial collateral ligament has a mildly lax
appearance but no signal abnormality within or about the ligament is
present.

CARTILAGE

Patellofemoral:  Appears normal.

Medial:  Appears normal.

Lateral:  Appears normal.

Joint:  No joint effusion is identified.

Popliteal Fossa:  Unremarkable.

Extensor Mechanism: Intact. Specifically, there is no evidence of
patellar dislocation. The medial patellofemoral ligament is intact
and normal in appearance.

Bones:  Normal marrow signal throughout.
IMPRESSION: Chronic, complete ACL tear.

Negative for meniscal tear.

Mildly lax configuration of the medial collateral ligament without
frank tear is most consistent with remote stretch injury.

## 2017-01-09 ENCOUNTER — Ambulatory Visit (INDEPENDENT_AMBULATORY_CARE_PROVIDER_SITE_OTHER): Payer: BC Managed Care – PPO | Admitting: Student

## 2017-01-09 ENCOUNTER — Encounter: Payer: Self-pay | Admitting: Student

## 2017-01-09 ENCOUNTER — Other Ambulatory Visit (HOSPITAL_COMMUNITY)
Admission: RE | Admit: 2017-01-09 | Discharge: 2017-01-09 | Disposition: A | Discharge status: Home / Self Care | Payer: BC Managed Care – PPO | Source: Ambulatory Visit | Attending: Family Medicine | Admitting: Family Medicine

## 2017-01-09 VITALS — BP 100/64 | HR 80 | Temp 98.3°F | Wt 236.0 lb

## 2017-01-09 DIAGNOSIS — Z7251 High risk heterosexual behavior: Secondary | ICD-10-CM | POA: Insufficient documentation

## 2017-01-09 NOTE — Patient Instructions (Signed)
Follow up as needed You will be called about your results ALWAYS USE AN CONDOM Call the officer with questions or concerns

## 2017-01-09 NOTE — Addendum Note (Signed)
Addended by: Steva ColderSCOTT, EMILY P on: 01/09/2017 02:43 PM   Modules accepted: Orders

## 2017-01-09 NOTE — Progress Notes (Signed)
0    Subjective:    Patient ID: Maurice Rubio, male    DOB: Dec 03, 1995, 21 y.o.   MRN: [id number]   CC: STD check  HPI: 21 y/o presents for STD check  STD check - sexually active with one male partners - does not use condoms - no fevers, abdominal pain, dysuria, penile pain or discharge, no lesions or bumps near penis - he wanted to just get checked for STDs as he has a new partner and has not been checked since started to have sex with her - he declines a GU exam  Smoking status reviewed  Review of Systems  Per HPI, else denies recent illness, fever, chest pain, shortness of breath,   Objective:  BP 100/64   Pulse 80   Temp 98.3 F (36.8 C) (Oral)   Wt 236 lb (107 kg)   SpO2 98%   BMI 40.51 kg/m  Vitals and nursing note reviewed  General: NAD Cardiac: RRR, Respiratory: CTAB, normal effort Abdomen: soft, nontender, nondistended, no hepatic or splenomegaly. Bowel sounds present Skin: warm and dry, no rashes noted Neuro: alert and oriented, no focal deficits   Assessment & Plan:    High risk sexual behavior STD check today - GC/CT, RPR, HIV    Tracy Kinner A. Kennon RoundsHaney MD, MS Family Medicine Resident PGY-3 Pager [phone]

## 2017-01-09 NOTE — Assessment & Plan Note (Signed)
STD check today - GC/CT, RPR, HIV

## 2017-01-10 ENCOUNTER — Telehealth: Payer: Self-pay | Admitting: Student

## 2017-01-10 LAB — URINE CYTOLOGY ANCILLARY ONLY
Chlamydia: NEGATIVE
Neisseria Gonorrhea: NEGATIVE
TRICH (WINDOWPATH): NEGATIVE

## 2017-01-10 LAB — RPR: RPR Ser Ql: NONREACTIVE

## 2017-01-10 LAB — HIV ANTIBODY (ROUTINE TESTING W REFLEX): HIV SCREEN 4TH GENERATION: NONREACTIVE

## 2017-01-10 NOTE — Telephone Encounter (Signed)
Patient is aware. Maurice Rubio,CMA  

## 2017-01-10 NOTE — Telephone Encounter (Signed)
Please call the patient and tell him that his RPR and HIV are negative

## 2017-03-24 ENCOUNTER — Encounter (HOSPITAL_COMMUNITY): Payer: Self-pay | Admitting: *Deleted

## 2017-03-24 ENCOUNTER — Emergency Department (HOSPITAL_COMMUNITY): Payer: BC Managed Care – PPO

## 2017-03-24 DIAGNOSIS — F17200 Nicotine dependence, unspecified, uncomplicated: Secondary | ICD-10-CM | POA: Diagnosis not present

## 2017-03-24 DIAGNOSIS — R0789 Other chest pain: Secondary | ICD-10-CM | POA: Diagnosis not present

## 2017-03-24 LAB — BASIC METABOLIC PANEL
Anion gap: 9 (ref 5–15)
BUN: 13 mg/dL (ref 6–20)
CALCIUM: 9.3 mg/dL (ref 8.9–10.3)
CO2: 21 mmol/L — ABNORMAL LOW (ref 22–32)
Chloride: 107 mmol/L (ref 101–111)
Creatinine, Ser: 0.91 mg/dL (ref 0.61–1.24)
GFR calc Af Amer: >60 mL/min (ref >60)
Glucose, Bld: 86 mg/dL (ref 65–99)
Potassium: 3.7 mmol/L (ref 3.5–5.1)
SODIUM: 137 mmol/L (ref 135–145)

## 2017-03-24 LAB — CBC
HCT: 43.9 % (ref 39.0–52.0)
Hemoglobin: 14.2 g/dL (ref 13.0–17.0)
MCH: 28.2 pg (ref 26.0–34.0)
MCHC: 32.3 g/dL (ref 30.0–36.0)
MCV: 87.1 fL (ref 78.0–100.0)
Platelets: 305 10*3/uL (ref 150–400)
RBC: 5.04 MIL/uL (ref 4.22–5.81)
RDW: 12.6 % (ref 11.5–15.5)
WBC: 11.5 10*3/uL — AB (ref 4.0–10.5)

## 2017-03-24 LAB — TROPONIN I: Troponin I: <0.03 ng/mL (ref <0.03)

## 2017-03-24 NOTE — ED Triage Notes (Signed)
Pt c/o intermittent chest pain for the past 3 months. Pain described as sharp, nothing makes the pain worse or better. Denies SOB

## 2017-03-25 ENCOUNTER — Emergency Department (HOSPITAL_COMMUNITY)
Admission: EM | Admit: 2017-03-25 | Discharge: 2017-03-25 | Disposition: A | Discharge status: Home / Self Care | Payer: BC Managed Care – PPO | Attending: Emergency Medicine | Admitting: Emergency Medicine

## 2017-03-25 DIAGNOSIS — R0789 Other chest pain: Secondary | ICD-10-CM

## 2017-03-25 LAB — D-DIMER, QUANTITATIVE: D-Dimer, Quant: 0.28 ug/mL-FEU (ref 0.00–0.50)

## 2017-03-25 MED ORDER — NAPROXEN 500 MG PO TABS: 500.0000 mg | ORAL_TABLET | Freq: Two times a day (BID) | ORAL | 0 refills | Status: DC

## 2017-03-25 MED ORDER — NAPROXEN 250 MG PO TABS
500.0000 mg | ORAL_TABLET | Freq: Once | ORAL | Status: AC
  Administered 2017-03-25: 500 mg via ORAL
  Filled 2017-03-25: qty 2

## 2017-03-25 NOTE — ED Provider Notes (Signed)
[facility] DEPT Provider Note   CSN: [id number] Arrival date & time: 03/24/17  2016     History   Chief Complaint Chief Complaint  Patient presents with  . Chest Pain    HPI Maurice Rubio is a 21 y.o. male.  HPI  This is a 21 year old male who presents for chest pain. Patient reports 3 month history of chest pain and shortness of breath. Over the last 2 days it has worsened. It is anterior nonradiating. He denies any fevers or cough. He states sometimes the pain is worse with movement or breathing. Denies any recent travel, recent hospitalization, history of blood clots. Current pain is 6 out of 10. He has not taken anything for the pain.  History reviewed. No pertinent past medical history.  Patient Active Problem List   Diagnosis Date Noted  . High risk sexual behavior 01/09/2017  . Nondisp fx of distal phalanx of right 2nd finger with delayed healing 11/21/2014  . Acute medial meniscal injury of left knee 05/30/2014    History reviewed. No pertinent surgical history.     Home Medications    Prior to Admission medications   Medication Sig Start Date End Date Taking? Authorizing Provider  ibuprofen (ADVIL,MOTRIN) 800 MG tablet Take 1 tablet (800 mg total) by mouth 3 (three) times daily. 02/08/16   Ozella RocksMerrell, David J, MD  naproxen (NAPROSYN) 500 MG tablet Take 1 tablet (500 mg total) by mouth 2 (two) times daily. 03/25/17   Narvel Kozub, Mayer Maskerourtney F, MD  traMADol (ULTRAM) 50 MG tablet Take 1 tablet (50 mg total) by mouth every 6 (six) hours as needed. 11/10/14   Graylon GoodBaker, Zachary H, PA-C    Family History No family history on file.  Social History Social History  Substance Use Topics  . Smoking status: Current Every Day Smoker  . Smokeless tobacco: Never Used  . Alcohol use Yes     Allergies   Patient has no known allergies.   Review of Systems Review of Systems  Constitutional: Negative for fever.  Respiratory: Positive for shortness of breath. Negative for  cough.   Cardiovascular: Positive for chest pain. Negative for leg swelling.  Gastrointestinal: Negative for abdominal pain, nausea and vomiting.  All other systems reviewed and are negative.    Physical Exam Updated Vital Signs BP 118/74   Pulse 65   Temp 98.5 F (36.9 C)   Resp 17   SpO2 98%   Physical Exam  Constitutional: He is oriented to person, place, and time. He appears well-developed and well-nourished. No distress.  HENT:  Head: Normocephalic and atraumatic.  Cardiovascular: Normal rate, regular rhythm and normal heart sounds.   No murmur heard. Pulmonary/Chest: Effort normal and breath sounds normal. No respiratory distress. He has no wheezes. He exhibits tenderness.  Anterior tenderness to palpation without crepitus  Abdominal: Soft. There is no tenderness.  Musculoskeletal: He exhibits no edema.  Neurological: He is alert and oriented to person, place, and time.  Skin: Skin is warm and dry.  Psychiatric: He has a normal mood and affect.  Nursing note and vitals reviewed.    ED Treatments / Results  Labs (all labs ordered are listed, but only abnormal results are displayed) Labs Reviewed  BASIC METABOLIC PANEL - Abnormal; Notable for the following:       Result Value   CO2 21 (*)    All other components within normal limits  CBC - Abnormal; Notable for the following:    WBC 11.5 (*)  All other components within normal limits  TROPONIN I  D-DIMER, QUANTITATIVE (NOT AT [facility])    EKG  EKG Interpretation  Date/Time:  Monday March 24 2017 20:16:07 EDT Ventricular Rate:  88 PR Interval:  160 QRS Duration: 88 QT Interval:  350 QTC Calculation: 423 R Axis:   54 Text Interpretation:  Normal sinus rhythm Normal ECG Confirmed by Ross Marcus ([id number]) on 03/25/2017 1:41:10 AM       Radiology Dg Chest 2 View  Result Date: 03/24/2017 CLINICAL DATA:  Chest pain and shortness of breath for 3 months. EXAM: CHEST  2 VIEW COMPARISON:  None. FINDINGS:  The heart size and mediastinal contours are within normal limits. Both lungs are clear. The visualized skeletal structures are unremarkable. IMPRESSION: Normal chest x-ray. Electronically Signed   By: Rudie Meyer M.D.   On: 03/24/2017 21:14    Procedures Procedures (including critical care time)  Medications Ordered in ED Medications  naproxen (NAPROSYN) tablet 500 mg (500 mg Oral Given 03/25/17 0154)     Initial Impression / Assessment and Plan / ED Course  I have reviewed the triage vital signs and the nursing notes.  Pertinent labs & imaging results that were available during my care of the patient were reviewed by me and considered in my medical decision making (see chart for details).     Patient resents for chest pain. It is localized on exam and reproducible. Lab work including troponin and screening d-dimer obtained and reassuring. EKG shows no evidence of arrhythmia or ischemia. Chest x-ray is reassuring. Recommend naproxen.  After history, exam, and medical workup I feel the patient has been appropriately medically screened and is safe for discharge home. Pertinent diagnoses were discussed with the patient. Patient was given return precautions.   Final Clinical Impressions(s) / ED Diagnoses   Final diagnoses:  Chest wall pain    New Prescriptions New Prescriptions   NAPROXEN (NAPROSYN) 500 MG TABLET    Take 1 tablet (500 mg total) by mouth 2 (two) times daily.     Shon Baton, MD 03/25/17 [phone]

## 2017-04-01 ENCOUNTER — Ambulatory Visit: Payer: BC Managed Care – PPO

## 2017-05-05 ENCOUNTER — Ambulatory Visit (INDEPENDENT_AMBULATORY_CARE_PROVIDER_SITE_OTHER): Payer: BC Managed Care – PPO | Admitting: *Deleted

## 2017-05-05 DIAGNOSIS — Z23 Encounter for immunization: Secondary | ICD-10-CM

## 2017-09-05 ENCOUNTER — Encounter: Payer: Self-pay | Admitting: Student

## 2017-09-05 ENCOUNTER — Ambulatory Visit (INDEPENDENT_AMBULATORY_CARE_PROVIDER_SITE_OTHER): Payer: BC Managed Care – PPO | Admitting: Student

## 2017-09-05 ENCOUNTER — Other Ambulatory Visit: Payer: Self-pay

## 2017-09-05 VITALS — BP 105/80 | HR 81 | Temp 98.3°F | Wt 233.0 lb

## 2017-09-05 DIAGNOSIS — R0789 Other chest pain: Secondary | ICD-10-CM | POA: Diagnosis not present

## 2017-09-05 MED ORDER — NAPROXEN 500 MG PO TABS: 500.0000 mg | ORAL_TABLET | Freq: Two times a day (BID) | ORAL | 0 refills | Status: AC

## 2017-09-05 NOTE — Progress Notes (Signed)
Subjective:    Maurice Rubio is a 21 y.o. old male here for chest pain.  He is here with his girlfriend.  HPI Chest pain: this has been going on for almost a one year.  No change.  Not sure about the inciting factor. Denies history of trauma, fall,  injury or working out. 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 He still does some lifting. He describes his pain as sharp and tight. No sure about the triggers. Pain with deep breathing but not always. Mostly on left chest anteriorly but could be on the right. No radiation to his arms or jaw. Better with lying down. Pain on and off everyday. No associated symptoms. No nausea, diaphoresis, cough, dyspnea or heartburn.  He is worried that this could be something serious. He went to ED about 8 months ago and cardiac work up was negative. He was treated as musculoskeletal chest pain with NSAIDs.  He says he did not notice significant improvement with NSAIDs. Mother and father with history of hypertension but no other cardiac disease. Denies smoking but vapes. Denies drinking. No drug use.  PMH/Problem List: has Acute medial meniscal injury of left knee; Nondisp fx of distal phalanx of right 2nd finger with delayed healing; and High risk sexual behavior on their problem list.   has no past medical history on file.  FH:  No family history on file.  SH Social History   Tobacco Use  . Smoking status: Current Every Day Smoker  . Smokeless tobacco: Never Used  Substance Use Topics  . Alcohol use: Yes  . Drug use: No    Review of Systems Review of systems negative except for pertinent positives and negatives in history of present illness above.     Objective:     Vitals:   09/05/17 1058  BP: 105/80  Pulse: 81  Temp: 98.3 F (36.8 C)  TempSrc: Oral  SpO2: 98%  Weight: 233 lb (105.7 kg)   Body mass index is 39.99 kg/m.  Physical Exam  GEN: Appears well, no  apparent distress.  Appears obese HEM: negative for cervical or periauricular lymphadenopathies CVS: RRR, nl s1 & s2, no murmurs, no edema RESP: no IWOB, good air movement bilaterally, CTAB GI: BS present & normal, soft, NTND MSK: Tenderness to palpation over his left chest.  No overlying skin change or swelling SKIN: Acanthosis nigricans around his neck ENDO: negative thyromegally NEURO: alert and oiented appropriately, no gross deficits PSYCH: euthymic mood with congruent affect Assessment and Plan:  1. Chest wall pain: pain is reproducible with palpation over his left upper chest anteriorly.  He has full range of motion in his neck and shoulders.  Cardiopulmonary exam within normal limits.  He seems reassured to hear that his pain is unlikely to come from his lungs or his heart.  I recommended trying naproxen scheduled for 5 days and then as needed.  I also recommended trying heating pad or ice as needed.   2. Morbid obesity (HCC): BMI 40.  Although he is reassured to his that he is chest pain is unlikely to come from his lungs or his heart at this time, he understands his risk of diabetes, hypertension or cardiac disease down the road due to his morbid obesity.  He has acanthosis nigricans around his neck which is a sign for insulin resistance. I recommended follow-up with his PCP.  He seems very interested when I brought  up our nutrition clinic.  I gave him the phone number to call and schedule an appointment.  Advised him to call and leave a voicemail with callback number if there is no answer.   Return if symptoms worsen or fail to improve.  Almon Herculesaye T Gonfa, MD 09/05/17 Pager: [phone]

## 2017-09-05 NOTE — Patient Instructions (Addendum)
It was great seeing you today! We have addressed the following issues today  Chest wall pain: This is likely from the muscle or bones in your chest.  Very unlikely to come from your heart or your lung.  We sent a prescription for naproxen to your pharmacy.  Take this medication twice a day for the next 5 days and then as needed.  Take this medication with food.  Weight: Your BMI is 40.  This is very high.  This puts you at risk of diabetes, hypertension or heart disease.  I strongly recommend losing weight.  Please see below for some tips on diet and exercise.    If we did any lab work today, and the results require attention, either me or my nurse will get in touch with you. If everything is normal, you will get a letter in mail and a message via . If you don't hear from us in two weeks, please give us a call. Otherwise, we look forward to seeing you again at your next visit. If you have any questions or concerns before then, please call the clinic at [phone].  Please bring all your medications to every doctors visit  Sign up for My Chart to have easy access to your labs results, and communication with your Primary care physician.    Please check-out at the front desk before leaving the clinic.    Take Care,   Dr. Alanda SlimGonfa Portion Size   Choose healthier foods such as 100% whole grains, vegetables, fruits, beans, nut seeds, olive oil, most vegetable oils, fat-free dietary, wild game and fish.   Avoid sweet tea, other sweetened beverages, soda, fruit juice, cold cereal and milk and trans fat.   Eat at least 3 meals and 1-2 snacks per day.  Aim for no more than 5 hours between eating.  Eat breakfast within one hour of getting up.    Exercise at least 150 minutes per week, including weight resistance exercises 3 or 4 times per week.   Try to lose at least 7-10% of your current body weight.   Limit your salt (Sodium) intake to less than 2 gm (2000 mg) a day if you have  conditions such as  elevated blood pressure, heart failure...    You may also read about DASH and/or Mediterranean diet at the following web site if you have blood pressure or heart condition. [employer].com.cywww.mayoclinic.org/healthy-lifestyle/nutrition-and-healthy-eating/in-depth/dash-diet http://mckinney.org/www.mayoclinic.org/healthy-lifestyle/nutrition-and-healthy-eating/in-depth/mediterranean-diet/

## 2017-09-15 ENCOUNTER — Ambulatory Visit (INDEPENDENT_AMBULATORY_CARE_PROVIDER_SITE_OTHER): Payer: BC Managed Care – PPO

## 2017-09-15 DIAGNOSIS — Z111 Encounter for screening for respiratory tuberculosis: Secondary | ICD-10-CM

## 2017-09-15 NOTE — Progress Notes (Signed)
TB skin test applied to L ventral forearm. Pt informed to schedule appt for nurse visit in 48-72 hours to have site read. Shawna OrleansMeredith B Thomsen, RN

## 2017-09-17 ENCOUNTER — Ambulatory Visit: Payer: BC Managed Care – PPO

## 2017-09-17 DIAGNOSIS — Z111 Encounter for screening for respiratory tuberculosis: Secondary | ICD-10-CM

## 2017-09-17 LAB — TB SKIN TEST: TB SKIN TEST: NEGATIVE

## 2017-09-17 NOTE — Progress Notes (Signed)
Patient here today to have PPD site read.   PPD read and results entered in Epic. Result: 0 mm induration. Interpretation: Negative Donovin Kraemer B Tivis Wherry, RN    

## 2017-10-01 ENCOUNTER — Telehealth: Payer: Self-pay

## 2017-10-01 NOTE — Telephone Encounter (Signed)
Pt called requesting a refill of Tramadol. Pt advised we did not prescribe him this medication- that according to his chart [facility]. Advised patient that per Dr. Sundra AlandGonfa's last note he was recommending he take Naproxen- can that is OTC. Pt agreeable Shawna OrleansMeredith B Leannah Guse, RN

## 2018-01-28 ENCOUNTER — Other Ambulatory Visit (HOSPITAL_COMMUNITY)
Admission: RE | Admit: 2018-01-28 | Discharge: 2018-01-28 | Disposition: A | Discharge status: Home / Self Care | Payer: BC Managed Care – PPO | Source: Ambulatory Visit | Attending: Family Medicine | Admitting: Family Medicine

## 2018-01-28 ENCOUNTER — Other Ambulatory Visit: Payer: Self-pay

## 2018-01-28 ENCOUNTER — Ambulatory Visit (INDEPENDENT_AMBULATORY_CARE_PROVIDER_SITE_OTHER): Payer: BC Managed Care – PPO | Admitting: Family Medicine

## 2018-01-28 VITALS — BP 114/74 | HR 79 | Temp 97.6°F | Ht 65.0 in | Wt 229.0 lb

## 2018-01-28 DIAGNOSIS — Z113 Encounter for screening for infections with a predominantly sexual mode of transmission: Secondary | ICD-10-CM | POA: Insufficient documentation

## 2018-01-28 MED ORDER — NAPROXEN 500 MG PO TABS: 500.0000 mg | ORAL_TABLET | Freq: Two times a day (BID) | ORAL | 0 refills | Status: DC | PRN

## 2018-01-28 NOTE — Progress Notes (Signed)
   Subjective:    Patient ID: Maurice Rubio, male    DOB: 1996-05-24, 22 y.o.   MRN: [id number]   CC: STD check  HPI: Patient is a 22 yo male who present here today for STD check. Patient reports that he gets an annual check. He denies any recent sexual encounter or risky behavior. Patient is feeling well and denies any discharge,burning with urination,blister or rash.  Smoking status reviewed   ROS: all other systems were reviewed and are negative other than in the HPI   No past medical history on file.  No past surgical history on file.  Past medical history, surgical, family, and social history reviewed and updated in the EMR as appropriate.  Objective:  BP 114/74   Pulse 79   Temp 97.6 F (36.4 C) (Oral)   Ht 5\' 5"  (1.651 m)   Wt 229 lb (103.9 kg)   SpO2 99%   BMI 38.11 kg/m   Vitals and nursing note reviewed  General: NAD, pleasant, able to participate in exam Cardiac: RRR, normal heart sounds, no murmurs. 2+ radial and PT pulses bilaterally Respiratory: CTAB, normal effort, No wheezes, rales or rhonchi Abdomen: soft, nontender, nondistended, no hepatic or splenomegaly, +BS GU: Deferred Extremities: no edema or cyanosis. WWP. Skin: warm and dry, no rashes noted Neuro: alert and oriented x4, no focal deficits Psych: Normal affect and mood   Assessment & Plan:   Routine screening for STI (sexually transmitted infection) Patient here for "annual STD" check denies any risky behavior or health concerns.  --Will check GC/CH, RPR, HIV and trich --Will follow up on results    Lovena NeighboursAbdoulaye Binta Statzer, MD [facility] PGY-3

## 2018-01-28 NOTE — Assessment & Plan Note (Signed)
Patient here for "annual STD" check denies any risky behavior or health concerns.  --Will check GC/CH, RPR, HIV and trich --Will follow up on results

## 2018-01-29 LAB — URINE CYTOLOGY ANCILLARY ONLY
CHLAMYDIA, DNA PROBE: NEGATIVE
NEISSERIA GONORRHEA: NEGATIVE
Trichomonas: NEGATIVE

## 2018-01-29 LAB — RPR: RPR Ser Ql: NONREACTIVE

## 2018-01-29 LAB — HIV ANTIBODY (ROUTINE TESTING W REFLEX): HIV Screen 4th Generation wRfx: NONREACTIVE

## 2018-03-03 ENCOUNTER — Encounter: Payer: Self-pay | Admitting: Family Medicine

## 2018-03-05 ENCOUNTER — Other Ambulatory Visit: Payer: Self-pay

## 2018-03-05 ENCOUNTER — Ambulatory Visit (INDEPENDENT_AMBULATORY_CARE_PROVIDER_SITE_OTHER): Payer: BC Managed Care – PPO | Admitting: Family Medicine

## 2018-03-05 VITALS — BP 118/70 | HR 82 | Temp 98.7°F | Ht 65.0 in | Wt 224.0 lb

## 2018-03-05 DIAGNOSIS — Z789 Other specified health status: Secondary | ICD-10-CM

## 2018-03-05 DIAGNOSIS — F411 Generalized anxiety disorder: Secondary | ICD-10-CM

## 2018-03-05 DIAGNOSIS — F17200 Nicotine dependence, unspecified, uncomplicated: Secondary | ICD-10-CM

## 2018-03-05 NOTE — Patient Instructions (Addendum)
Thank you for coming in to see Maurice Rubio today. Please see below to review our plan for today's visit.  I do agree that your symptoms seem to be related to anxiety.  These are commonly triggered by stressors in life.  We will get you established with our behavioral therapist here at the clinic and will notify your primary care physician of this visit.  I would like you to discontinue all cigarette smoking.  Over time, we would like to make it a goal to decrease the use of your Juul.  Behavioral therapy will set up the appointment and get in touch with you over the next few weeks.  Please call the clinic at [phone] if your symptoms worsen or you have any concerns. It was our pleasure to serve you.  Durward Parcelavid McMullen, DO [facility], PGY-3
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
## 2018-03-06 DIAGNOSIS — Z789 Other specified health status: Secondary | ICD-10-CM | POA: Insufficient documentation

## 2018-03-06 DIAGNOSIS — F411 Generalized anxiety disorder: Secondary | ICD-10-CM | POA: Insufficient documentation

## 2018-03-06 DIAGNOSIS — Z720 Tobacco use: Secondary | ICD-10-CM | POA: Insufficient documentation

## 2018-03-06 DIAGNOSIS — F17200 Nicotine dependence, unspecified, uncomplicated: Secondary | ICD-10-CM

## 2018-03-06 NOTE — Assessment & Plan Note (Signed)
Chronic.  Minimal use of cigarettes.  Interested in cessation. - Advised patient to avoid cigarettes entirely given minimal use and lack of nicotine dependence

## 2018-03-06 NOTE — Assessment & Plan Note (Addendum)
Chronic.  Every day use.  Has concomitant GAD.  Likely contributing. - Advised patient to wean off of e-cigarette use
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
## 2018-03-19 ENCOUNTER — Ambulatory Visit (INDEPENDENT_AMBULATORY_CARE_PROVIDER_SITE_OTHER): Payer: BC Managed Care – PPO | Admitting: Psychology

## 2018-03-19 DIAGNOSIS — F411 Generalized anxiety disorder: Secondary | ICD-10-CM

## 2018-03-19 NOTE — Patient Instructions (Signed)
It was so good to meet you today.  I scheduled an appointment for you for next Thursday at 8:30.  You like activity.  And activity (physical exercise) is one of the best things you can do to help your mental health.  You named swimming at the [facility].  The [employer] may also be an option.  You agreed to find out information today.  You agreed to get there once in the next week to see how it goes.    I will speak to your PCP, Dr. Chanetta Marshall, about the possibility of medicine.  Please watch the Ted Talk by Dewain Penning called the Power of Vulnerability.  You can find it on You Tube.  It is about 20 minutes long.  We will touch base on this.

## 2018-03-19 NOTE — Assessment & Plan Note (Addendum)
Maurice Rubio is neatly groomed and appropriately dressed.  He maintains good eye contact and is cooperative and attentive.  Speech is normal in tone, rate and rhythm.  Mood is reported as depressed and anxious.  He displays a bright affect.  Thought process is logical and goal directed.  Denied suicidal ideation.  Does not appear to be responding to any internal stimuli.  Able to maintain train of thought and concentrate on the questions.  Judgment and insight seem average.  Not sure GAD is the best (or maybe only) diagnosis.  Symptoms seem perhaps more consistent with social phobia or panic disorder.  PTSD is also a possibility.  Will consider given a PCL-C to assess PTSD symptoms next visit.  With regards to mood symptoms, he did not present today as someone who is significantly depressed.  I was struck by his joy in certain activities (going to the mountains, hanging out with his brothers) his passion for his career goals.  Finally, his current substance use makes a diagnosis more challenging.  Discussed that and options:  1.  Exercise.  He likes activity.  Has been active most his life.  Was able to name swimming immediately as something he would like to do but would like someone to go with him.  This is a potential barrier.  2.  Medication.  He is interested.  With a positive MDQ, and current substance use, medication decisions are more challenging.  Denies a personal or family history of Bipolar Disorder.  I would likely err on the side of an SSRI and monitor closely.    3.  Therapy.  He is interested and I think he would do well.  Will follow in a week with the homework outlined in the patient instructions.
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
## 2018-03-20 ENCOUNTER — Telehealth: Payer: Self-pay | Admitting: Family Medicine

## 2018-03-20 NOTE — Telephone Encounter (Signed)
Discussed patient case with Dr. Pascal Lux today.  Would be happy to see the patient to discuss starting medication for his mood.  Attempted to call and schedule this, however his voice mailbox is full.  If he returns call, or at his integrated care visit next week, feel free to schedule him with me.

## 2018-03-20 NOTE — Telephone Encounter (Signed)
Patient returned call and was given message. He states he will make an appt with PCP next week when he is here for his integrated care visit.  Ples Specter, RN [facility] RN)

## 2018-03-26 ENCOUNTER — Ambulatory Visit (INDEPENDENT_AMBULATORY_CARE_PROVIDER_SITE_OTHER): Payer: BC Managed Care – PPO | Admitting: Psychology

## 2018-03-26 DIAGNOSIS — F411 Generalized anxiety disorder: Secondary | ICD-10-CM

## 2018-03-26 NOTE — Patient Instructions (Addendum)
Please schedule with Dr. Chanetta Marshallimberlake or another medical provider to discuss medicine. Please follow-up with me on:  9/24 at 8:30.  Check out the calm app.  Things like breathing exercises and daily gratitudes can be a big help in managing mood and anxiety.  The swimming will also be a huge help.  You said you were going Friday to register and plan on swimming three days a week.  Please fill out the paperwork I gave you and leave at the front desk.

## 2018-03-26 NOTE — Progress Notes (Signed)
Integrated Behavioral Health Follow Up Visit   Number of Integrated Behavioral Health Clinician visits: 2/6 Session Start time: 8:40  Session End time: 9:20 Total time: 40 minutes   Reason for follow-up:  Patient has clinically significant symptoms of both anxiety and depression.  He also had a positive MDQ when he saw Dr. Abelardo DieselMcMullen.  Coming back to discuss behavioral interventions from last visit and the possibility of medication.  Issues discussed:    Mood issue:  Discussed Bipolar Disorder.  Patient states that people sometimes says he is "so Bipolar."  Notes reactivity in response to be pushed or challenged by others.  Does not report a clear picture of periods of time, lasting days, where he has symptoms consistent with mania or hypomania.  No hospitalizations or prior treatment for mental health issues.  Substance use:  He reports he has stopped the CBD because he thought he might be getting started on a medicine and he didn't want to interfere with the effects.  Reports he has decreased his use of Jule.  Behavioral interventions:  Looked into swimming.  Plans on the [employer].  States he will go tomorrow to register.  His goal is swimming three times a week.  Watched [employer] video two times.  Does not remember much of it.  Job:  Is interviewing for a new position tomorrow.  CNA with people with dementia.  He has enjoyed this work before and this job would give him a more predictable schedule.

## 2018-03-26 NOTE — Assessment & Plan Note (Signed)
Patient's self-report measures continue to be very high.  PHQ-9 was 23 today and GAD-7 was 20.  Denies SI / HI.  I haven't been able to get a clear diagnostic picture (considering MDD, GAD, Social Phobia, Bipolar Disorder).  I gave him the PCL-C (a civilian measure for PTSD) based on some trauma he reported last visit.  The scaled is based on the DSM-IV and he reported clinically significant symptoms in each category.  Also, the range of scores is 17-85 and he scored a 70.  This makes me wonder if PTSD is the underlying issue.  In terms of Bipolar disorder, it remains on the list largely because of a positive MDQ. His report of symptoms does not seem to fit.  Affect today was bright again.  He talked a bit about not fitting in and related to this, concerns with regards to race.  He is interested in medicine and it seems like a reasonable thing to try. Close follow-up will be important and he has demonstrated an ability to do that. Is scheduled to see Dr. Chanetta Marshallimberlake 9/20 and me again 9/24.    At my visit, will provide feedback on PCL-C and take a closer look at these symptoms.

## 2018-04-03 ENCOUNTER — Ambulatory Visit (INDEPENDENT_AMBULATORY_CARE_PROVIDER_SITE_OTHER): Payer: BC Managed Care – PPO | Admitting: Family Medicine

## 2018-04-03 VITALS — BP 120/70 | HR 83 | Temp 98.7°F | Ht 65.0 in | Wt 233.2 lb

## 2018-04-03 DIAGNOSIS — F411 Generalized anxiety disorder: Secondary | ICD-10-CM

## 2018-04-03 MED ORDER — SERTRALINE HCL 25 MG PO TABS: 25.0000 mg | ORAL_TABLET | Freq: Every day | ORAL | 0 refills | Status: DC

## 2018-04-03 NOTE — Progress Notes (Signed)
   CC: mood, starting medicine  HPI  We reviewed his mood history (people have mentioned he seems "bipolar), and he relates his predominant symptom is anxiety, which prevents him from reaching his goals. For example, he was going to start an exercise program with Dr. Pascal LuxKane, and he says he signed up but wasn't able to go do to feeling anxious.   He and Dr. Pascal LuxKane had discussed that although his MDQ was positive, his sxs are more in line with anxiety and PTSD. They discussed SSRI as an option, and he States he is "ready" to start medicine. Says he feels quite anxious today about our discussion. His GF in the past had zoloft and she was "more standoffish," so this does give him pause.   Never felt SI per his report, when we were discussing black box warning. He feels this will not be a problem for him, and he is willing to keep resources and call us if this emerges.   ROS: Denies CP, SOB, abdominal pain, dysuria, changes in BMs.   CC, SH/smoking status, and VS noted  Objective: BP 120/70   Pulse 83   Temp 98.7 F (37.1 C) (Oral)   Ht 5\' 5"  (1.651 m)   Wt 233 lb 3.2 oz (105.8 kg)   SpO2 99%   BMI 38.81 kg/m  Gen: NAD, alert, cooperative, and pleasant. HEENT: NCAT, EOMI, PERRL CV: RRR, no murmur Resp: CTAB, no wheezes, non-labored Ext: No edema, warm Neuro: Alert and oriented, Speech clear, No gross deficits  Assessment and plan:  GAD (generalized anxiety disorder) Start zoloft at 25mg  today. Follow up next week with Dr. Pascal LuxKane and then with me or Dr. Pascal LuxKane in 2 weeks and 4 weeks from today. Given education about black box warning, maximal effect takes time, and need to titrate dose.   No orders of the defined types were placed in this encounter.   Meds ordered this encounter  Medications  . sertraline (ZOLOFT) 25 MG tablet    Sig: Take 1 tablet (25 mg total) by mouth daily.    Dispense:  30 tablet    Refill:  0    Loni MuseKate Timberlake, MD, PGY3 04/08/2018 7:24 PM

## 2018-04-03 NOTE — Patient Instructions (Addendum)
It was a pleasure to see you today! Thank you for choosing [facility] for your primary care. Maurice Rubio was seen for anxiety.   Our plans for today were:  Start the medicine we discussed.   Taking the medicine as directed and not missing any doses is one of the best things you can do to treat your depression.  Here are some things to keep in mind:  1) Side effects (stomach upset, some increased anxiety) may happen before you notice a benefit.  These side effects typically go away over time. 2) Changes to your dose of medicine or a change in medication all together is sometimes necessary 3) Most people need to be on medication at least 6-12 months 4) Many people will notice an improvement within two weeks but the full effect of the medication can take up to 4-6 weeks 5) Stopping the medication when you start feeling better often results in a return of symptoms 6) If you start having thoughts of hurting yourself or others after starting this medicine, please call me at [phone] immediately.     You should return to our clinic to see Dr. Chanetta Marshallimberlake in 2 weeks for med check.   Best,  Dr. Chanetta Marshallimberlake

## 2018-04-07 ENCOUNTER — Ambulatory Visit: Payer: BC Managed Care – PPO

## 2018-04-08 NOTE — Assessment & Plan Note (Addendum)
Start zoloft at 25mg  today. Follow up next week with Dr. Pascal Lux and then with me or Dr. Pascal Lux in 2 weeks and 4 weeks from today. Given education about black box warning, maximal effect takes time, and need to titrate dose.

## 2018-04-25 ENCOUNTER — Other Ambulatory Visit: Payer: Self-pay | Admitting: Family Medicine

## 2018-04-27 NOTE — Telephone Encounter (Signed)
Will refill zoloft, but patient overdue for appt with me or Dr. Pascal Lux. He no showed our planned follow up with Dr. Pascal Lux on 9/24. We would love to help him, and if he is on this medicine he needs to be regularly checked on. Please call and have patient seen as soon as they can for mood follow up. Needs another appt prior to next refill.

## 2018-04-27 NOTE — Telephone Encounter (Signed)
Contacted pt and gave him the below information and scheduled him an appointment on 06/17/2018 @ 8:30am. Lamonte Sakai, Karly Pitter D, CMA

## 2018-06-17 ENCOUNTER — Encounter: Payer: Self-pay | Admitting: Family Medicine

## 2018-06-17 ENCOUNTER — Other Ambulatory Visit: Payer: Self-pay

## 2018-06-17 ENCOUNTER — Ambulatory Visit: Payer: BC Managed Care – PPO | Admitting: Family Medicine

## 2018-06-17 DIAGNOSIS — F411 Generalized anxiety disorder: Secondary | ICD-10-CM

## 2018-06-17 DIAGNOSIS — Z789 Other specified health status: Secondary | ICD-10-CM

## 2018-06-17 MED ORDER — SERTRALINE HCL 25 MG PO TABS: 25.0000 mg | ORAL_TABLET | Freq: Every day | ORAL | 0 refills | Status: DC

## 2018-06-17 NOTE — Progress Notes (Signed)
   CC: med follow up   HPI  Tobacco use- had previously been smoking THC and jewel pens with nicotene. Has been just smoking the jewel pens lately 2/2 feeling more anxious and not having his meds.   Picked up the zoloft on 9/20 and took all 30 tablets. He states he ran out and someone called and told him that he needed a new appt. He thinks his aunt may have gotten the pharmacy call about his October refill. Has not had any meds since middle of October. He felt his mood was greatly improved with the zoloft. Helped the most with "more calm" and "less stressed." less anxiety attacks. Feels last week he had more anxiety attacks, not as bad as previously.   Was previously living with aunt, now living with grandma and brother. Working at [employer].   Tolerability:  # of missed doses in the past week:  all Bothersome side effects:  none  Safety:  No feelings of SI or HI  Efficacy:  Was very effective, but ran out as above.  If improvement noted, impact on function:  Less anxiety  ROS: Denies CP, SOB, abdominal pain, dysuria, changes in BMs.   CC, SH/smoking status, and VS noted  Objective: BP 128/88   Pulse 77   Temp 98.5 F (36.9 C) (Oral)   Ht 5\' 5"  (1.651 m)   Wt 220 lb 3.2 oz (99.9 kg)   SpO2 99%   BMI 36.64 kg/m  Gen: NAD, alert, cooperative, and pleasant. Neuro: Alert and oriented, Speech clear, No gross deficits  Assessment and plan:  GAD (generalized anxiety disorder) GAD 7 16, PHQ 9 19. Unfortunately, patient ran out of SSRI despite refills being sent and didn't understand that he needed to go to pharmacy for refill or there was miscommunication with which phone number that the pharmacy had.  We discussed that he will need to take an active role in his treatment plan, and he will leave today making a follow-up appointment in 2 weeks with me as well as going to the pharmacy to pick up his refill and ensuring that they have accurate contact information for him as  well.  He was counseled on the black box warning again.  And also counseled to never stop his SSRI abruptly without calling his doctor.  Electronic cigarette use Congratulated on stopping THC, encouraged continued efforts at cessation and counseling on risks of vaping with vitamin E concerns. Patient was aware.    No orders of the defined types were placed in this encounter.   Meds ordered this encounter  Medications  . sertraline (ZOLOFT) 25 MG tablet    Sig: Take 1 tablet (25 mg total) by mouth daily.    Dispense:  90 tablet    Refill:  0     Loni MuseKate Aadit Hagood, MD, PGY3 06/17/2018 8:55 AM

## 2018-06-17 NOTE — Patient Instructions (Signed)
It was a pleasure to see you today! Thank you for choosing [facility] for your primary care. Maurice Rubio was seen for anxiety.   Our plans for today were:  Make an appt with me in 2 weeks.   Go to the pharmacy to get your medicine, and make sure they have the correct phone number for you.   congrats on stopping smoking!   Call me if you have any concerns.    When you're going through tough times, it's easy to feel lonely and overwhelmed. Remember that YOU ARE NOT ALONE and we at [facility] want to support you during this difficult time.  There are many ways to get help and support when you are ready.  You can call the following help lines: - National suicide hotline (234-423-62541-7252417476) - National Hopeline Network (1-800-SUICIDE)  You can schedule an appointment with a team member at [facility] - your primary care doc or one of our counselors.  We are all here to support you. A doctor is on call 24/7, so call us if you need to [phone]).  There are many treatments that can help people during difficult times, and we can talk with you about medications, counseling, diet, and other options. We want to offer you HOPE that it won't always feel this bad.   If you are seriously thinking about hurting yourself or have a plan, please call 911 or go to any emergency room right away for immediate help.   Best,  Dr. Chanetta Marshallimberlake

## 2018-06-17 NOTE — Assessment & Plan Note (Addendum)
GAD 7 16, PHQ 9 19. Unfortunately, patient ran out of SSRI despite refills being sent and didn't understand that he needed to go to pharmacy for refill or there was miscommunication with which phone number that the pharmacy had.  We discussed that he will need to take an active role in his treatment plan, and he will leave today making a follow-up appointment in 2 weeks with me as well as going to the pharmacy to pick up his refill and ensuring that they have accurate contact information for him as well.  He was counseled on the black box warning again.  And also counseled to never stop his SSRI abruptly without calling his doctor.

## 2018-06-17 NOTE — Assessment & Plan Note (Signed)
Congratulated on stopping THC, encouraged continued efforts at cessation and counseling on risks of vaping with vitamin E concerns. Patient was aware.

## 2018-08-05 ENCOUNTER — Ambulatory Visit: Payer: BC Managed Care – PPO | Admitting: Family Medicine

## 2018-09-27 IMAGING — CR DG CHEST 2V
2 series · 2 of 2 positions shown · non-contrast
Comparison: None.

CLINICAL DATA: Chest pain and shortness of breath for 3 months.

EXAM:
CHEST  2 VIEW

[chest pa]
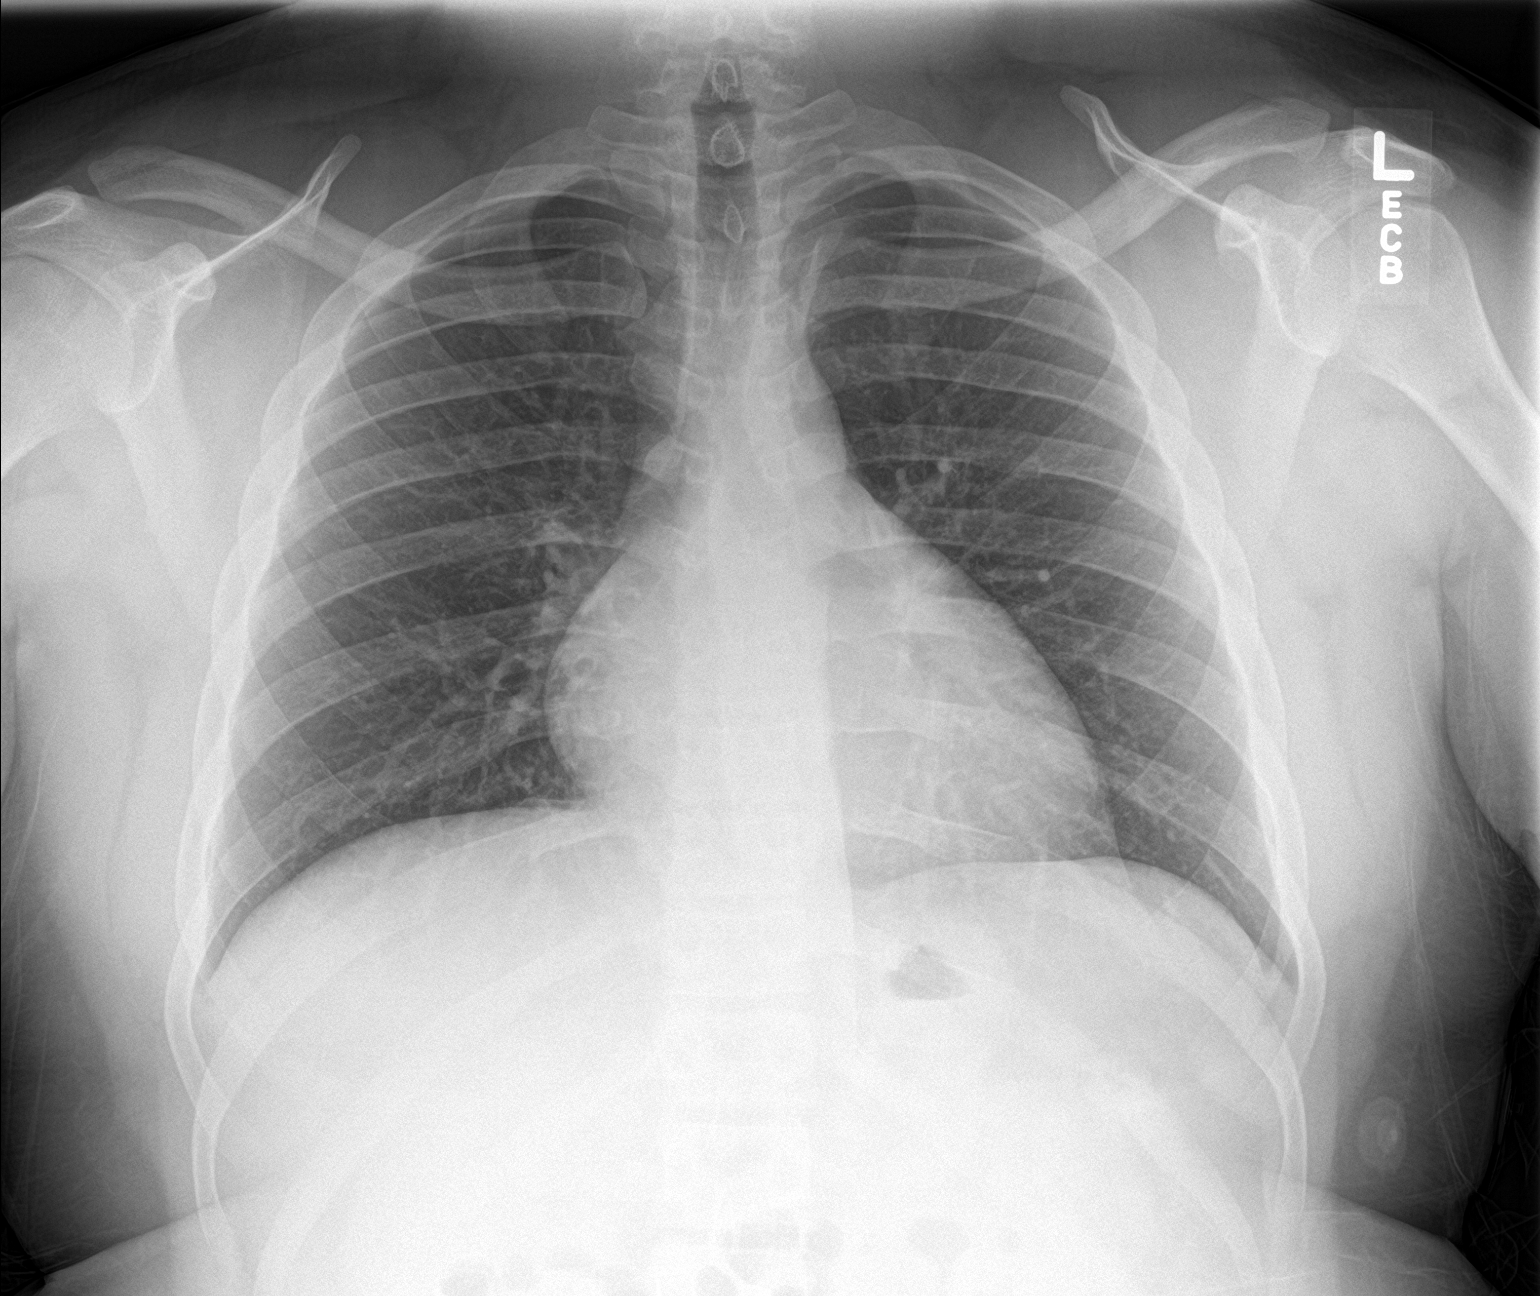

[chest lat]
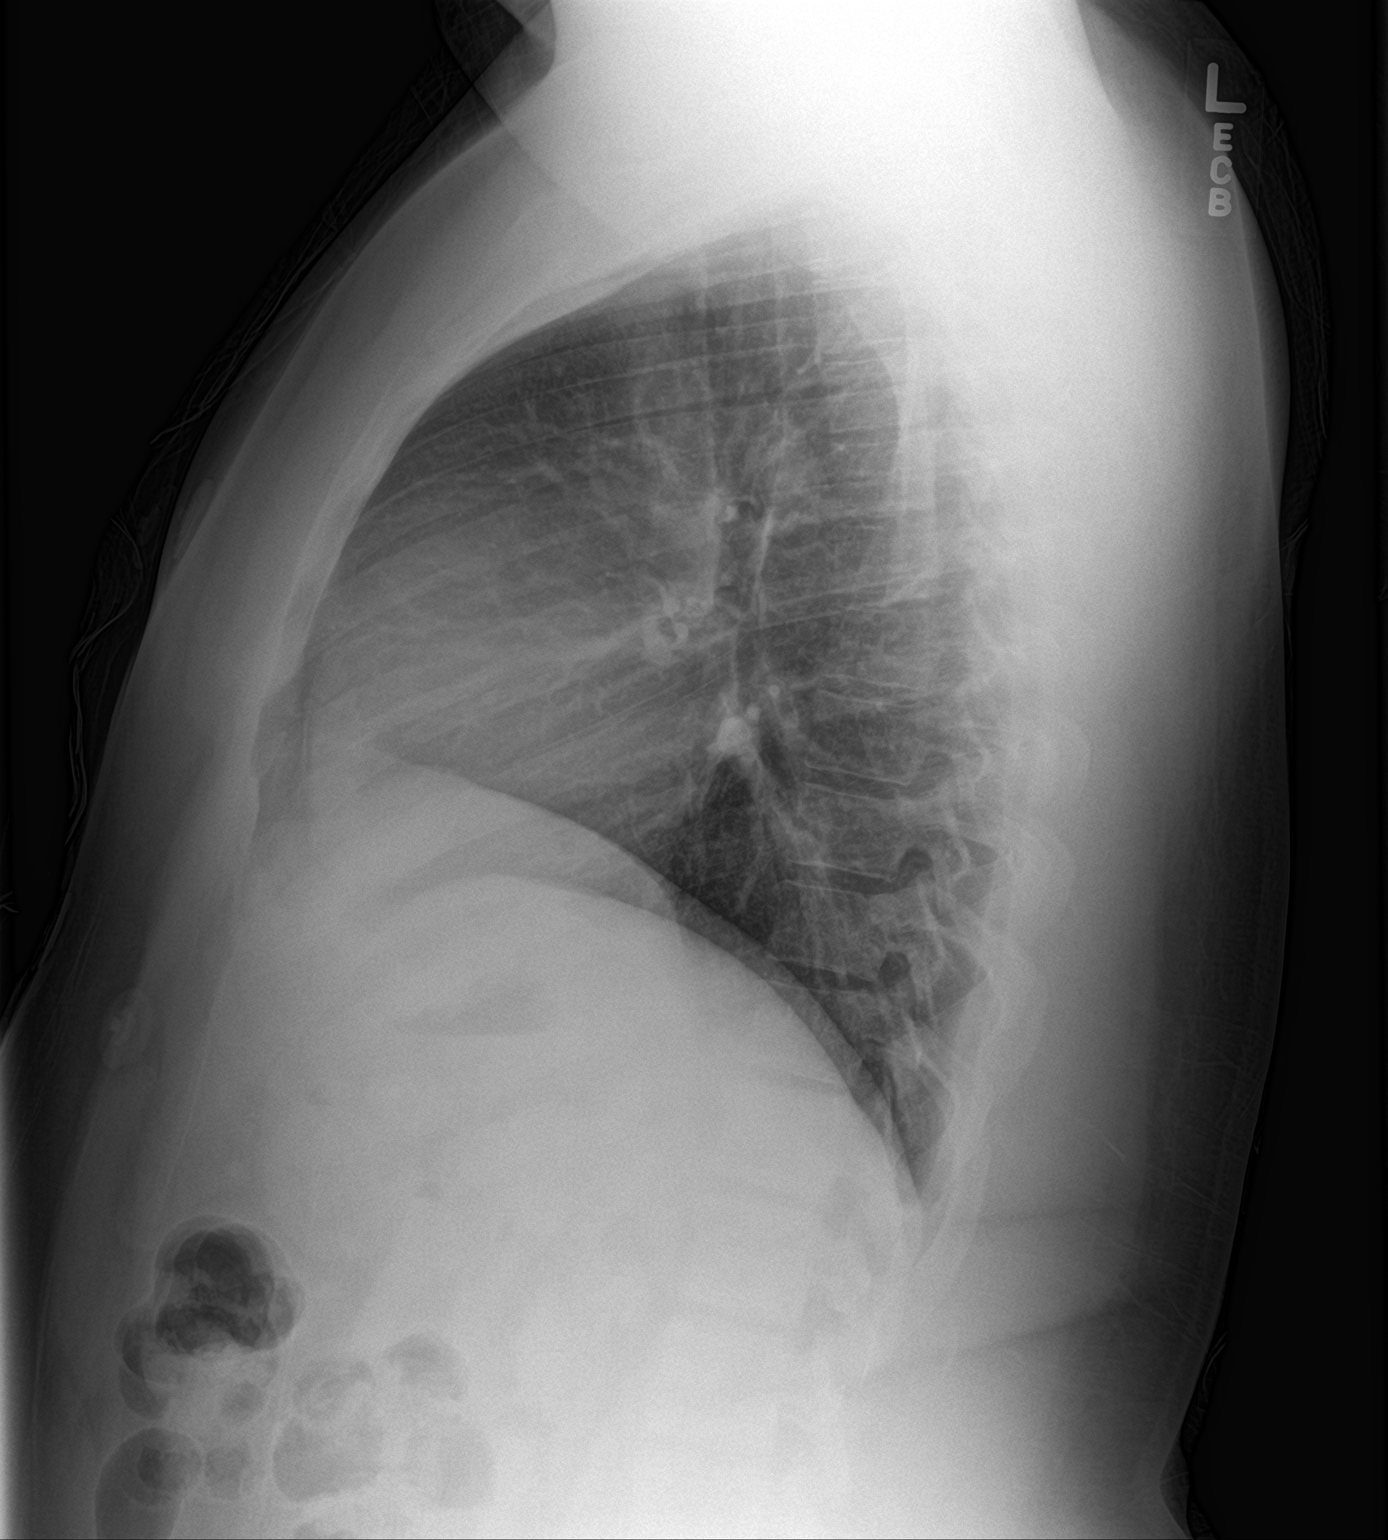

[2 of 2 positions shown; findings below may reference images not displayed]

FINDINGS: The heart size and mediastinal contours are within normal limits.
Both lungs are clear. The visualized skeletal structures are
unremarkable.
IMPRESSION: Normal chest x-ray.

## 2018-12-01 ENCOUNTER — Other Ambulatory Visit: Payer: Self-pay | Admitting: Family Medicine

## 2018-12-02 NOTE — Telephone Encounter (Signed)
Tried to contact pt to inform him of below and to assist in getting appointment scheduled and there was no option to LVM due to VM either not being set up or full.  If pt calls back please inform him of below and assist in getting an appointment scheduled. Sharryn Belding Zimmerman Rumple, CMA

## 2018-12-02 NOTE — Telephone Encounter (Signed)
White team, can we call patient and offer him a virtual visit? He missed his follow up for mood in January and we should check in prior to refills to ensure he has actually been taking this. I should have Friday availability.

## 2019-01-29 ENCOUNTER — Encounter: Payer: Self-pay | Admitting: Family Medicine

## 2019-01-29 ENCOUNTER — Ambulatory Visit (INDEPENDENT_AMBULATORY_CARE_PROVIDER_SITE_OTHER): Payer: BC Managed Care – PPO | Admitting: Family Medicine

## 2019-01-29 ENCOUNTER — Other Ambulatory Visit: Payer: Self-pay

## 2019-01-29 DIAGNOSIS — S83412A Sprain of medial collateral ligament of left knee, initial encounter: Secondary | ICD-10-CM | POA: Insufficient documentation

## 2019-01-29 DIAGNOSIS — F411 Generalized anxiety disorder: Secondary | ICD-10-CM | POA: Diagnosis not present

## 2019-01-29 MED ORDER — SERTRALINE HCL 50 MG PO TABS: 50.0000 mg | ORAL_TABLET | Freq: Every day | ORAL | 3 refills | Status: AC

## 2019-01-29 NOTE — Patient Instructions (Addendum)
Thank you for coming to see us today. I want you to rest your knee over the next few days, elevate, ice, and ibuprofen 800 mg ever 6 hours. I do not want you returning to work until I reevaluate you on Monday. You may also where your knee brace which will help with stability   I also increased the dose on your Zoloft to 50 mg and sent the prescription to your pharmacy.   Please call if you have any questions or concerns and I will see you on Monday.  Combined Knee Ligament Sprain  A ligament is a tough band of tissue that connects one bone to another bone. There are four ligaments in your knee. Together, they provide stability for your knee joint. A combined knee ligament sprain is an injury that happens when more than one knee ligament is severely stretched or torn. This kind of injury is also called an injury to multiple structures of the knee. What are the causes? This condition may be caused by:  A direct hit (trauma) to the knee.  Overextending the knee.  Twisting the knee. What increases the risk? You are more likely to develop this condition if you participate in certain sports, including:  Contact sports, such as football, rugby, and lacrosse.  Sports that take place on uneven ground, such as soccer and cross country.  Sports that involve quick changes in position, such as basketball, dancing, gymnastics, and skiing. You are also more likely to develop this condition if you:  Have poor strength or flexibility.  Are overweight.  Have overly flexible joints (joint laxity).  Have previously injured your knee or had surgery on your knee. What are the signs or symptoms? Common symptoms of this condition include:  Swelling.  Severe pain with movement.  Pain when the injured area is touched.  Instability.  A popping sound that happens at the time of injury.  Not being able to stand or use the injured knee to support (bear) one's body weight. How is this diagnosed?  This condition is diagnosed with a physical exam. You may also have imaging tests, such as:  X-ray.  MRI. How is this treated? Treatment depends on how badly the ligaments are injured and may include:  Ice applied to the affected area.  Medicines for pain.  Placing the knee in a brace or splint to prevent movement and support the joint.  Physical therapy to help strengthen and stabilize the knee joint.  Surgery to reconstruct a torn ligament. This may be done in severe cases. Follow these instructions at home: If you have a splint or brace:  Wear the splint or brace as told by your health care provider. Remove it only as told by your health care provider.  Loosen the splint or brace if your toes tingle, become numb, or turn cold and blue.  Keep the splint or brace clean.  If the splint or brace is not waterproof: ? Do not let it get wet. ? Cover it with a watertight covering when you take a bath or shower. Managing pain, stiffness, and swelling   If directed, put ice on the injured area. ? If you have a removable splint or brace, remove it as told by your health care provider. ? Put ice in a plastic bag. ? Place a towel between your skin and the bag. ? Leave the ice on for 20 minutes, 2-3 times a day.  Move your toes often to reduce stiffness and swelling.  Raise (  elevate) the injured area above the level of your heart while you are sitting or lying down. Medicines  Take over-the-counter and prescription medicines only as told by your health care provider.  Ask your health care provider if the medicine prescribed to you: ? Requires you to avoid driving or using heavy machinery. ? Can cause constipation. You may need to take actions to prevent or treat constipation, such as:  Drink enough fluid to keep your urine pale yellow.  Take over-the-counter or prescription medicines.  Eat foods that are high in fiber, such as beans, whole grains, and fresh fruits and  vegetables.  Limit foods that are high in fat and processed sugars, such as fried or sweet foods. Activity  Ask your health care provider when it is safe to drive if you have a splint or brace on your knee.  Return to your normal activities as told by your health care provider. Ask your health care provider what activities are safe for you.  Perform exercises daily as told by your health care provider or physical therapist.  Do not use the injured limb to support your body weight until your health care provider says that you can. Use crutches as told by your health care provider. General instructions  Do not take baths, swim, or use a hot tub until your health care provider approves. Ask your health care provider if you may take showers. You may only be allowed to take sponge baths.  Do not use any products that contain nicotine or tobacco, such as cigarettes, e-cigarettes, and chewing tobacco. These can delay healing. If you need help quitting, ask your health care provider.  Keep all follow-up visits as told by your health care provider. This is important. Contact a health care provider if:  Your symptoms do not improve.  Your symptoms get worse.  You develop tingling or numbness in the area of your injury. Get help right away if:  You develop severe numbness or tingling in your leg or foot.  Your foot turns blue, white, or gray, and it feels cold. Summary  A combined knee ligament sprain is an injury that happens when more than one knee ligament is severely stretched or torn.  Follow instructions for the care of your knee, including rest and activity, as told by your health care provider.  Do not use the injured limb to support your body weight until your health care provider says that you can. Use crutches as told by your health care provider.  Contact a health care provider if your symptoms do not improve or get worse.  Keep all follow-up visits as told by your health  care provider. This is important. This information is not intended to replace advice given to you by your health care provider. Make sure you discuss any questions you have with your health care provider. Document Released: 07/01/2005 Document Revised: 10/21/2018 Document Reviewed: 02/18/2018 Elsevier Patient Education  2020 [employer].

## 2019-01-29 NOTE — Assessment & Plan Note (Addendum)
Patient reports poor compliance due to not being able to get a refill on his prescription.  - Reported that he had taken 2- 25 mg at a time because he felt better at that dose  - Increasing dose to Zoloft 50 mg daily  - Counseled on proper medication adherence and black box warnings of SSRI
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
## 2019-01-29 NOTE — Progress Notes (Signed)
    Subjective:   SUBJECTIVE: Maurice Rubio is a 23 y.o. male who sustained a left knee injury 1 day(s) ago. Mechanism of injury: Patient reports that he was breaking up a dog fight at work and one of the dogs began to chase him. He jumped on the hood of a car and his knee bent in. He reports that he did not hear a pop or noise. Reports ACL tear on that knee 4 years ago where he had an MRI to confirm. Says that he did not have surgery for it. Immediate symptoms: immediate pain, inability to bear weight directly after injury, no deformity was noted by the patient. Symptoms have been recurrent every time her times to bear weight since that time. Prior history of related problems: previous knee injury ACL tear 4 years ago.  Objective:  Physical Exam: BP 118/82   Pulse 85   SpO2 99%   Vital signs as noted above. Appearance: alert, well appearing, and in no distress and overweight. Knee exam: soft tissue tenderness over medial epicondyle  of left knee, minor effusion of left knee, reduced range of motion with extension, mild pain to varus and valgus stress normal contralateral knee exam.  Assessment/Plan:  MCL sprain of left knee - Injured 01/28/19  - jumped on the hood of a car and had immediate pain  - rest the injured area as much as practical, apply ice packs, elevate the injured limb, compressive bandage, obtain crutches - see primary care physician in follow up - note for work until after visit on Monday    GAD (generalized anxiety disorder) Patient reports poor compliance due to not being able to get a refill on his prescription.  - Reported that he had taken 2- 25 mg at a time because he felt better at that dose  - Increasing dose to Zoloft 50 mg daily  - Counseled on proper medication adherence and black box warnings of SSRI    Lab Orders  No laboratory test(s) ordered today    Meds ordered this encounter  Medications  . sertraline (ZOLOFT) 50 MG tablet    Sig: Take 1  tablet (50 mg total) by mouth daily.    Dispense:  30 tablet    Refill:  3      Gifford Shave, MD  PGY-1, [facility]  01/29/19 11:33 AM

## 2019-02-01 ENCOUNTER — Encounter: Payer: Self-pay | Admitting: Family Medicine

## 2019-02-01 ENCOUNTER — Other Ambulatory Visit: Payer: Self-pay

## 2019-02-01 ENCOUNTER — Ambulatory Visit (INDEPENDENT_AMBULATORY_CARE_PROVIDER_SITE_OTHER): Payer: BC Managed Care – PPO | Admitting: Family Medicine

## 2019-02-01 VITALS — BP 120/60 | HR 75 | Ht 65.0 in | Wt 224.0 lb

## 2019-02-01 DIAGNOSIS — S83412D Sprain of medial collateral ligament of left knee, subsequent encounter: Secondary | ICD-10-CM | POA: Diagnosis not present

## 2019-02-01 NOTE — Progress Notes (Signed)
    Subjective:  Maurice Rubio is a 23 y.o. male who presents to the [facility] today for a follow up visit after being seen for left knee pain.   HPI:  Maurice Rubio is a 23 y.o. male who sustained a left knee injury 1 day(s) ago. Mechanism of injury: Patient reports that he was breaking up a dog fight at work and one of the dogs began to chase him. He jumped on the hood of a car and his knee bent in. He reports that he did not hear a pop or noise. Reports ACL tear on that knee 4 years ago where he had an MRI to confirm. Says that he did not have surgery for it. Immediate symptoms: immediate pain, inability to bear weight directly after injury, no deformity was noted by the patient. Symptoms have been recurrent every time her times to bear weight since that time. Prior history of related problems: previous knee injury ACL tear 4 years ago.  Patient reports he did well over the weekend and that the pain decreased with rest and OTC NSAIDs 500 mg twice daily. Denies any new injuries to knee.  He reports not icing it "to much" and that he is wearing the knee brace he was given for an ACL tear. The brace is providing support and helping with the pain. He reports minor pain to palpation on the medial aspect of his left knee. He is eager to return to work.   Objective:  Physical Exam: BP 120/60   Pulse 75   Ht 5\' 5"  (1.651 m)   Wt 224 lb (101.6 kg)   SpO2 99%   BMI 37.28 kg/m   Appearance: alert, well appearing, and in no distress and overweight. Knee exam: soft tissue tenderness over medial epicondyle  of left knee,still minor effusion of left knee, range of motion with extension improved with mild pain, mild pain to valgus stress. No pain to varus.  normal contralateral knee exam. Ambulates well.   Assessment/Plan:  MCL sprain of left knee Initially injured 01/28/19 after jumping on the hood of a car while running from a dog at work - Continue to rest/elevate/ice knee when possible - Continue  with OTC NSAIDs as needed  - Continue to wear brace for comfort and support - May return to work tomorrow  - Discussed why he may need to return    Gifford Shave, MD  PGY-1, [facility]  02/01/19 11:44 AM

## 2019-02-01 NOTE — Patient Instructions (Signed)
Thank you for coming to see us today. Your knee seems to be doing better but I would like you to continue wearing your brace, ice when possible, and take ibuprofen 800 mg as needed. I do not think you need any x-rays or MRI at this time.   If you have any questions of concerns feel free to call.   Medial Collateral Knee Ligament Sprain  The medial collateral ligament (MCL) is a tough band of tissue in the knee that connects the thigh bone to the shin bone. Your MCL prevents your knee from moving too far inward and helps to keep your knee stable. An MCL sprain is a stretch or tear in the MCL. What are the causes? This condition may be caused by:  A hard, direct hit (trauma) to the inside of your knee. This is a common cause.  Your knee falling inward when you run, change directions quickly (cut), jump, or pivot.  Repeatedly overstretching the MCL. What increases the risk? The following factors make you more likely to develop this condition:  Playing contact sports, such as wrestling or football.  Participating in sports that involve sudden movements of cutting, twisting, or turning. These movements are common in hockey, skiing, and soccer.  Having weak hip and core muscles. What are the signs or symptoms? Symptoms of this condition include:  Feeling or hearing a popping at the time of injury.  Pain on the inside of the knee.  Swelling in the knee.  Bruising around the knee.  Tenderness when pressing the inside of the knee.  Feeling unstable when you stand, like your knee will give way.  Difficulty walking on uneven surfaces. How is this diagnosed? This condition may be diagnosed based on:  Your medical history.  A physical exam.  Imaging tests, such as an X-ray, ultrasound, or MRI. During your physical exam, your health care provider will check for pain, limited motion, and instability. How is this treated? Treatment for this condition depends on how severe the injury  is. Treatment may include:  Keeping weight off the knee until swelling and pain improve.  Raising (elevating) the knee above the level of your heart. This helps to reduce swelling.  Icing the knee. This helps to reduce swelling.  Taking an NSAID, such as ibuprofen. This helps to reduce pain and swelling.  Using a knee brace, elastic sleeve, or crutches while the injury heals.  Using a knee brace when participating in athletic activities.  Doing rehab exercises (physical therapy).  Surgery. This may be needed if: ? Your MCL tore all the way through. ? Your knee is unstable. ? Your knee is not getting better with other treatments. Follow these instructions at home: If you have a brace or sleeve:  Wear it as told by your health care provider. Remove it only as told by your health care provider.  Loosen the brace or remove the sleeve if your toes tingle, become numb, or turn cold and blue.  Keep the brace or sleeve clean.  If the brace or sleeve is not waterproof: ? Do not let it get wet. ? Cover it with a watertight covering when you take a bath or shower. Managing pain, stiffness, and swelling   If directed, put ice on the inside area of your knee. ? If you have a removable brace or sleeve, remove it as told by your health care provider. ? Put ice in a plastic bag. ? Place a towel between your skin and the  bag. ? Leave the ice on for 20 minutes, 2-3 times a day.  Move your foot and toes often to reduce stiffness and swelling.  Elevate the injured area above the level of your heart while you are sitting or lying down. Activity  Ask your health care provider when it is safe to drive if you have a brace or sleeve on your leg.  Return to your normal activities as told by your health care provider. Ask your health care provider what activities are safe for you.  Do exercises as told by your health care provider.  Do not use the injured leg to support your body weight  until your health care provider says that you can. Use crutches as told by your health care provider. General instructions  Take over-the-counter and prescription medicines only as told by your health care provider.  Do not use any products that contain nicotine or tobacco, such as cigarettes, e-cigarettes, and chewing tobacco. These can delay healing. If you need help quitting, ask your health care provider.  Keep all follow-up visits as told by your health care provider. This is important. How is this prevented?  Warm up and stretch before being active.  Cool down and stretch after being active.  Give your body time to rest between periods of activity.  Make sure to use equipment that fits you.  Be safe and responsible while being active. This will help you avoid falls.  Do at least 150 minutes of moderate-intensity exercise each week, such as brisk walking or water aerobics.  Maintain physical fitness, including: ? Strength. ? Flexibility. ? Cardiovascular fitness. ? Endurance. Contact a health care provider if:  Your symptoms do not improve.  Your symptoms get worse. Summary  An MCL sprain is a knee injury that is caused by stretching the MCL too far. The injury can involve a tear in the MCL.  Treatment for this condition depends on how severe the injury is. It may include rest, wearing a brace, or surgery.  Do not use the injured leg to support your body weight until your health care provider says that you can. Use crutches as told by your health care provider.  Contact a health care provider if your symptoms do not get better or they get worse.  Keep all follow-up visits as told by your health care provider. This is important. This information is not intended to replace advice given to you by your health care provider. Make sure you discuss any questions you have with your health care provider. Document Released: 07/01/2005 Document Revised: 02/18/2018 Document  Reviewed: 02/18/2018 Elsevier Patient Education  2020 [employer].

## 2019-02-01 NOTE — Assessment & Plan Note (Signed)
Initially injured 01/28/19 after jumping on the hood of a car while running from a dog at work - Continue to rest/elevate/ice knee when possible - Continue with OTC NSAIDs as needed  - Continue to wear brace for comfort and support - May return to work tomorrow  - Discussed why he may need to return

## 2019-02-03 ENCOUNTER — Encounter: Payer: Self-pay | Admitting: Family Medicine

## 2021-12-18 ENCOUNTER — Encounter: Payer: Self-pay | Admitting: *Deleted

## 2024-05-24 ENCOUNTER — Encounter (HOSPITAL_COMMUNITY): Payer: Self-pay

## 2024-05-25 ENCOUNTER — Encounter: Payer: Self-pay | Admitting: Nurse Practitioner

## 2024-05-25 ENCOUNTER — Ambulatory Visit (INDEPENDENT_AMBULATORY_CARE_PROVIDER_SITE_OTHER): Payer: PRIVATE HEALTH INSURANCE | Admitting: Nurse Practitioner

## 2024-05-25 VITALS — BP 122/82 | HR 82 | Ht 65.0 in | Wt 250.6 lb

## 2024-05-25 DIAGNOSIS — Z789 Other specified health status: Secondary | ICD-10-CM

## 2024-05-25 DIAGNOSIS — Z1329 Encounter for screening for other suspected endocrine disorder: Secondary | ICD-10-CM

## 2024-05-25 DIAGNOSIS — Z113 Encounter for screening for infections with a predominantly sexual mode of transmission: Secondary | ICD-10-CM

## 2024-05-25 DIAGNOSIS — Z23 Encounter for immunization: Secondary | ICD-10-CM | POA: Diagnosis not present

## 2024-05-25 DIAGNOSIS — R079 Chest pain, unspecified: Secondary | ICD-10-CM | POA: Diagnosis not present

## 2024-05-25 DIAGNOSIS — Z1321 Encounter for screening for nutritional disorder: Secondary | ICD-10-CM

## 2024-05-25 DIAGNOSIS — F17200 Nicotine dependence, unspecified, uncomplicated: Secondary | ICD-10-CM | POA: Insufficient documentation

## 2024-05-25 DIAGNOSIS — Z13228 Encounter for screening for other metabolic disorders: Secondary | ICD-10-CM

## 2024-05-25 DIAGNOSIS — Z130 Encounter for screening for diseases of the blood and blood-forming organs and certain disorders involving the immune mechanism: Secondary | ICD-10-CM

## 2024-05-25 NOTE — Patient Instructions (Addendum)
 Please come fasting to your next appointment Okay to take Tylenol  650 mg every 6 hours as needed for chest wall pain, I also encourage use of heating pad as  needed    1. Chest pain, unspecified type (Primary)  - EKG 12-Lead  2. Screening for endocrine, nutritional, metabolic and immunity disorder  - CBC - CMP14+EGFR  3. Need for influenza vaccination   4. Morbid obesity (HCC)  - TSH  5. Screen for STD (sexually transmitted disease)  - RPR - Chlamydia/Gonococcus/Trichomonas, NAA - HepB+HepC+HIV Panel    It is important that you exercise regularly at least 30 minutes 5 times a week as tolerated  Think about what you will eat, plan ahead. Choose  clean, green, fresh or frozen over canned, processed or packaged foods which are more sugary, salty and fatty. 70 to 75% of food eaten should be vegetables and fruit. Three meals at set times with snacks allowed between meals, but they must be fruit or vegetables. Aim to eat over a 12 hour period , example 7 am to 7 pm, and STOP after  your last meal of the day. Drink water,generally about 64 ounces per day, no other drink is as healthy. Fruit juice is best enjoyed in a healthy way, by EATING the fruit.  Thanks for choosing [facility] we consider it a privelige to serve you.

## 2024-05-25 NOTE — Assessment & Plan Note (Signed)
 No symptoms , monogamous relationship. - Ordered screening for sexually transmitted infections.

## 2024-05-25 NOTE — Assessment & Plan Note (Addendum)
 Wt Readings from Last 3 Encounters:  05/25/24 250 lb 9.6 oz (113.7 kg)  02/01/19 224 lb (101.6 kg)  06/17/18 220 lb 3.2 oz (99.9 kg)   Body mass index is 41.7 kg/m.   Discussed obesity-related risks. - Encouraged dietary modifications: 70-80% vegetables and protein, reduce carbohydrates, replace juice and soda with water. - Recommended portion control and three healthy meals daily. - Advised moderate to vigorous exercise for 30 minutes, five days a week as tolerated.

## 2024-05-25 NOTE — Progress Notes (Signed)
 New Patient Office Visit  Subjective:  Patient ID: Maurice Rubio, male    DOB: [birth year]  Age: 28 y.o. MRN: [id number]  CC:  Chief Complaint  Patient presents with   Establish Care   Labs Only    For overall health    Chest Pain    Intermittent, more frequently then would like    HPI   Discussed the use of AI scribe software for clinical note transcription with the patient, who gave verbal consent to proceed.  History of Present Illness Maurice Rubio is a 28 year old male with past medical history of anxiety who presents with chest pain and to establish care  He has experienced intermittent chest pain since 2019. During his last visit to [facility], he was informed of 'fluid in his chest' and was prescribed an anti-inflammatory medication, possibly naproxen , which provided some relief. Despite this, he continues to experience chest pain intermittently. He associates his chest pain with vaping, which he has been doing regularly. No recent increase in chest pain severity.[place] his [place] involves pushing wheelchairs and lifting bags, which may contribute to his chest pain. He has two jobs and describes his work as physically demanding.   He has a history of anxiety for which Zoloft  was prescribed, but he is not currently taking it. He reports that his anxiety is manageable without medication. No recent increase in anxiety or depression symptoms.  He denies the use of drugs and cigarettes but vapes regularly and consumes alcohol occasionally. He is not on any specific diet and has a weight of 250.6 pounds at a height of 5'8.     Assessment & Plan   Assessment & Plan        History reviewed. No pertinent past medical history.  Past Surgical History:  Procedure Laterality Date   WISDOM TOOTH EXTRACTION     2015    Family History  Problem Relation Age of Onset   Hypertension Mother     Social History   Socioeconomic  History   Marital status: Single    Spouse name: Not on file   Number of children: Not on file   Years of education: Not on file   Highest education level: Not on file  Occupational History   Not on file  Tobacco Use   Smoking status: Never   Smokeless tobacco: Never  Vaping Use   Vaping status: Every Day  Substance and Sexual Activity   Alcohol use: Yes    Comment: occassionally   Drug use: No   Sexual activity: Yes    Birth control/protection: Condom  Other Topics Concern   Not on file  Social History Narrative   Not on file   Social Drivers of Health   Financial Resource Strain: Not on file  Food Insecurity: Not on file  Transportation Needs: Not on file  Physical Activity: Not on file  Stress: Not on file  Social Connections: Not on file  Intimate Partner Violence: Not on file    ROS Review of Systems  Constitutional:  Negative for appetite change, chills, fatigue and fever.  HENT:  Negative for congestion, postnasal drip, rhinorrhea and sneezing.   Respiratory:  Negative for cough, shortness of breath and wheezing.   Cardiovascular:  Negative for chest pain, palpitations and leg swelling.  Gastrointestinal:  Negative for abdominal pain, constipation, nausea and vomiting.  Genitourinary:  Negative for difficulty urinating, dysuria, flank pain and frequency.  Musculoskeletal:  Negative for arthralgias, back pain, joint swelling and myalgias.  Skin:  Negative for color change, pallor, rash and wound.  Neurological:  Negative for dizziness, facial asymmetry, weakness, numbness and headaches.  Psychiatric/Behavioral:  Negative for behavioral problems, confusion, self-injury and suicidal ideas.     Objective:   Today's Vitals: BP 122/82 (BP Location: Left Arm, Patient Position: Sitting, Cuff Size: Large)   Pulse 82   Ht 5' 5 (1.651 m)   Wt 250 lb 9.6 oz (113.7 kg)   SpO2 99%   BMI 41.70 kg/m   Physical Exam Vitals and nursing note reviewed.   Constitutional:      General: He is not in acute distress.    Appearance: Normal appearance. He is obese. He is not ill-appearing, toxic-appearing or diaphoretic.  Eyes:     General: No scleral icterus.       Right eye: No discharge.        Left eye: No discharge.     Extraocular Movements: Extraocular movements intact.     Conjunctiva/sclera: Conjunctivae normal.  Cardiovascular:     Rate and Rhythm: Normal rate and regular rhythm.     Pulses: Normal pulses.     Heart sounds: Normal heart sounds. No murmur heard.    No friction rub. No gallop.  Pulmonary:     Effort: Pulmonary effort is normal. No respiratory distress.     Breath sounds: Normal breath sounds. No stridor. No wheezing, rhonchi or rales.  Chest:     Chest wall: No tenderness.  Abdominal:     General: There is no distension.     Palpations: Abdomen is soft.     Tenderness: There is no abdominal tenderness. There is no right CVA tenderness, left CVA tenderness or guarding.  Musculoskeletal:        General: No swelling, tenderness, deformity or signs of injury.     Right lower leg: No edema.     Left lower leg: No edema.  Skin:    General: Skin is warm and dry.     Capillary Refill: Capillary refill takes less than 2 seconds.     Coloration: Skin is not jaundiced or pale.     Findings: No bruising, erythema or lesion.  Neurological:     Mental Status: He is alert and oriented to person, place, and time.     Motor: No weakness.     Gait: Gait normal.  Psychiatric:        Mood and Affect: Mood normal.        Behavior: Behavior normal.        Thought Content: Thought content normal.        Judgment: Judgment normal.     Assessment & Plan:   Problem List Items Addressed This Visit       Other   Screen for STD (sexually transmitted disease)   No symptoms , monogamous relationship. - Ordered screening for sexually transmitted infections.       Relevant Orders   RPR   Chlamydia/Gonococcus/Trichomonas,  NAA   HepB+HepC+HIV Panel   Electronic cigarette use   Nicotine dependence (vaping) Chronic nicotine dependence with associated health risks discussed. - Encouraged cessation of vaping.       Chest pain - Primary   EKG showed normal sinus rhythm rate of 74 beats per minutes  Intermittent chest pain since 2019, likely musculoskeletal. No myocardial infarction concern. - Encouraged cessation of vaping. - Advised Tylenol  650 mg every six hours as needed for pain. -  Recommended heating pad use and avoidance of exacerbating activities.        Relevant Orders   EKG 12-Lead   Morbid obesity (HCC)   Wt Readings from Last 3 Encounters:  05/25/24 250 lb 9.6 oz (113.7 kg)  02/01/19 224 lb (101.6 kg)  06/17/18 220 lb 3.2 oz (99.9 kg)   Body mass index is 41.7 kg/m.   Discussed obesity-related risks. - Encouraged dietary modifications: 70-80% vegetables and protein, reduce carbohydrates, replace juice and soda with water. - Recommended portion control and three healthy meals daily. - Advised moderate to vigorous exercise for 30 minutes, five days a week as tolerated.        Relevant Orders   TSH   Other Visit Diagnoses       Screening for endocrine, nutritional, metabolic and immunity disorder       Relevant Orders   CBC   CMP14+EGFR     Need for influenza vaccination           Outpatient Encounter Medications as of 05/25/2024  Medication Sig   sertraline  (ZOLOFT ) 50 MG tablet Take 1 tablet (50 mg total) by mouth daily. (Patient not taking: Reported on 05/25/2024)   No facility-administered encounter medications on file as of 05/25/2024.    Follow-up: Return in about 2 months (around 07/25/2024) for CPE.   Lalo Tromp R Joanne Brander, FNP

## 2024-05-25 NOTE — Assessment & Plan Note (Addendum)
 EKG showed normal sinus rhythm rate of 74 beats per minutes  Intermittent chest pain since 2019, likely musculoskeletal. No myocardial infarction concern. - Encouraged cessation of vaping. - Advised Tylenol  650 mg every six hours as needed for pain. - Recommended heating pad use and avoidance of exacerbating activities.

## 2024-05-25 NOTE — Assessment & Plan Note (Signed)
 Nicotine dependence (vaping) Chronic nicotine dependence with associated health risks discussed. - Encouraged cessation of vaping.

## 2024-05-26 ENCOUNTER — Ambulatory Visit: Payer: Self-pay | Admitting: Nurse Practitioner

## 2024-05-26 DIAGNOSIS — Z23 Encounter for immunization: Secondary | ICD-10-CM | POA: Diagnosis not present

## 2024-05-26 LAB — CBC
Hematocrit: 46.1 % (ref 37.5–51.0)
Hemoglobin: 14.7 g/dL (ref 13.0–17.7)
MCH: 28.8 pg (ref 26.6–33.0)
MCHC: 31.9 g/dL (ref 31.5–35.7)
MCV: 90 fL (ref 79–97)
Platelets: 316 x10E3/uL (ref 150–450)
RBC: 5.11 x10E6/uL (ref 4.14–5.80)
RDW: 11.6 % (ref 11.6–15.4)
WBC: 7.7 x10E3/uL (ref 3.4–10.8)

## 2024-05-26 LAB — CMP14+EGFR
ALT: 34 IU/L (ref 0–44)
AST: 32 IU/L (ref 0–40)
Albumin: 4.7 g/dL (ref 4.3–5.2)
Alkaline Phosphatase: 100 IU/L (ref 47–123)
BUN/Creatinine Ratio: 13 (ref 9–20)
BUN: 13 mg/dL (ref 6–20)
Bilirubin Total: 0.5 mg/dL (ref 0.0–1.2)
CO2: 26 mmol/L (ref 20–29)
Calcium: 9.8 mg/dL (ref 8.7–10.2)
Chloride: 101 mmol/L (ref 96–106)
Creatinine, Ser: 1.01 mg/dL (ref 0.76–1.27)
Globulin, Total: 2.9 g/dL (ref 1.5–4.5)
Glucose: 100 mg/dL — ABNORMAL HIGH (ref 70–99)
Potassium: 4.3 mmol/L (ref 3.5–5.2)
Sodium: 140 mmol/L (ref 134–144)
Total Protein: 7.6 g/dL (ref 6.0–8.5)
eGFR: 103 mL/min/1.73 (ref >59)

## 2024-05-26 LAB — HEPB+HEPC+HIV PANEL
HIV Screen 4th Generation wRfx: NONREACTIVE
Hep B C IgM: NEGATIVE
Hep B Core Total Ab: NEGATIVE
Hep B E Ab: NONREACTIVE
Hep B E Ag: NEGATIVE
Hep B Surface Ab, Qual: NONREACTIVE
Hep C Virus Ab: NONREACTIVE
Hepatitis B Surface Ag: NEGATIVE

## 2024-05-26 LAB — TSH: TSH: 2.57 u[IU]/mL (ref 0.450–4.500)

## 2024-05-26 LAB — RPR: RPR Ser Ql: NONREACTIVE

## 2024-05-26 NOTE — Addendum Note (Signed)
 Addended by: VICTORY IHA on: 05/26/2024 01:07 PM   Modules accepted: Orders

## 2024-05-27 LAB — CHLAMYDIA/GONOCOCCUS/TRICHOMONAS, NAA
Chlamydia by NAA: NEGATIVE
Gonococcus by NAA: NEGATIVE
Trich vag by NAA: NEGATIVE

## 2024-07-26 ENCOUNTER — Encounter: Payer: Self-pay | Admitting: Nurse Practitioner

## 2024-07-26 ENCOUNTER — Ambulatory Visit: Payer: Self-pay | Admitting: Nurse Practitioner

## 2024-10-22 ENCOUNTER — Encounter: Payer: PRIVATE HEALTH INSURANCE | Admitting: Nurse Practitioner
